# Patient Record
Sex: Female | Born: 1975 | Race: White | Hispanic: No | Smoking: Never smoker
Health system: Southern US, Community
[De-identification: ages and names within clinical notes are randomized; demographics above are authoritative.]

## PROBLEM LIST (undated history)

## (undated) DIAGNOSIS — R002 Palpitations: Secondary | ICD-10-CM

## (undated) DIAGNOSIS — R5383 Other fatigue: Secondary | ICD-10-CM

## (undated) DIAGNOSIS — L7211 Pilar cyst: Secondary | ICD-10-CM

## (undated) DIAGNOSIS — I340 Nonrheumatic mitral (valve) insufficiency: Secondary | ICD-10-CM

## (undated) HISTORY — PX: TONSILLECTOMY: SUR1361

## (undated) HISTORY — PX: OTHER SURGICAL HISTORY: SHX169

## (undated) HISTORY — DX: Palpitations: R00.2

## (undated) HISTORY — DX: Pilar cyst: L72.11

## (undated) HISTORY — DX: Other fatigue: R53.83

---

## 1996-09-04 HISTORY — PX: TUBAL LIGATION: SHX77

## 2006-10-22 ENCOUNTER — Emergency Department (HOSPITAL_COMMUNITY): Admission: EM | Admit: 2006-10-22 | Discharge: 2006-10-22 | Payer: Self-pay | Admitting: Emergency Medicine

## 2007-10-30 ENCOUNTER — Emergency Department (HOSPITAL_COMMUNITY): Admission: EM | Admit: 2007-10-30 | Discharge: 2007-10-30 | Payer: Self-pay | Admitting: Emergency Medicine

## 2008-01-07 ENCOUNTER — Emergency Department (HOSPITAL_COMMUNITY): Admission: EM | Admit: 2008-01-07 | Discharge: 2008-01-07 | Payer: Self-pay | Admitting: Emergency Medicine

## 2010-04-28 ENCOUNTER — Emergency Department (HOSPITAL_COMMUNITY): Admission: EM | Admit: 2010-04-28 | Discharge: 2010-04-28 | Payer: Self-pay | Admitting: Emergency Medicine

## 2010-05-03 ENCOUNTER — Emergency Department (HOSPITAL_COMMUNITY): Admission: EM | Admit: 2010-05-03 | Discharge: 2010-05-03 | Payer: Self-pay | Admitting: Emergency Medicine

## 2012-09-06 ENCOUNTER — Encounter (HOSPITAL_COMMUNITY): Payer: Self-pay | Admitting: Emergency Medicine

## 2012-09-06 ENCOUNTER — Emergency Department (HOSPITAL_COMMUNITY)
Admission: EM | Admit: 2012-09-06 | Discharge: 2012-09-06 | Disposition: A | Discharge status: Home / Self Care | Payer: BC Managed Care – PPO | Attending: Emergency Medicine | Admitting: Emergency Medicine

## 2012-09-06 DIAGNOSIS — J111 Influenza due to unidentified influenza virus with other respiratory manifestations: Secondary | ICD-10-CM

## 2012-09-06 DIAGNOSIS — J3489 Other specified disorders of nose and nasal sinuses: Secondary | ICD-10-CM | POA: Insufficient documentation

## 2012-09-06 DIAGNOSIS — R5381 Other malaise: Secondary | ICD-10-CM | POA: Insufficient documentation

## 2012-09-06 DIAGNOSIS — R630 Anorexia: Secondary | ICD-10-CM | POA: Insufficient documentation

## 2012-09-06 DIAGNOSIS — IMO0001 Reserved for inherently not codable concepts without codable children: Secondary | ICD-10-CM | POA: Insufficient documentation

## 2012-09-06 DIAGNOSIS — R509 Fever, unspecified: Secondary | ICD-10-CM | POA: Insufficient documentation

## 2012-09-06 DIAGNOSIS — R51 Headache: Secondary | ICD-10-CM | POA: Insufficient documentation

## 2012-09-06 DIAGNOSIS — J029 Acute pharyngitis, unspecified: Secondary | ICD-10-CM | POA: Insufficient documentation

## 2012-09-06 DIAGNOSIS — R110 Nausea: Secondary | ICD-10-CM | POA: Insufficient documentation

## 2012-09-06 MED ORDER — OSELTAMIVIR PHOSPHATE 75 MG PO CAPS: 75.0000 mg | ORAL_CAPSULE | Freq: Two times a day (BID) | ORAL | Status: DC

## 2012-09-06 NOTE — ED Provider Notes (Signed)
History     CSN: [id number]  Arrival date & time 09/06/12  1511   First MD Initiated Contact with Patient 09/06/12 1604      No chief complaint on file.   (Consider location/radiation/quality/duration/timing/severity/associated sxs/prior treatment) HPI Comments: Pt presents to the ED with complaints of flu-like symptoms of dry cough, nasal congestion, sore throat, muscle aches, chills, fever, headaches, fatigue, decreased appetite.  She reports that her fever was 102 orally yesterday. The patient states that the symptoms started two days ago.  Pt has been around other sick contacts and did not get the flu shot this year. The patient denies neck pain/stiffness, weakness, vision changes, severe abdominal pain, inability to eat or drink, difficulty breathing, SOB, wheezing, or chest pain. The patient has tried Alka-Seltzer for her symptoms without relief.    The history is provided by the patient.    No past medical history on file.  No past surgical history on file.  No family history on file.  History  Substance Use Topics  . Smoking status: Not on file  . Smokeless tobacco: Not on file  . Alcohol Use: Not on file    OB History    No data available      Review of Systems  Constitutional: Positive for fever, chills, appetite change and fatigue.  HENT: Positive for congestion, sore throat and rhinorrhea. Negative for ear pain, drooling, trouble swallowing, neck pain, neck stiffness and voice change.   Respiratory: Positive for cough. Negative for wheezing.   Gastrointestinal: Positive for nausea. Negative for vomiting, abdominal pain, diarrhea and constipation.  Genitourinary: Negative for dysuria.  Skin: Negative for rash.  Neurological: Positive for headaches.  Psychiatric/Behavioral: Negative for confusion.    Allergies  Penicillins  Home Medications   Current Outpatient Rx  Name  Route  Sig  Dispense  Refill  . ASPIRIN EFFERVESCENT 325 MG PO TBEF   Oral   Take  325 mg by mouth every 6 (six) hours as needed. Cold symptoms           BP 109/71  Pulse 84  Temp 98.7 F (37.1 C) (Oral)  Resp 18  SpO2 98%  Physical Exam  Nursing note and vitals reviewed. Constitutional: She appears well-developed and well-nourished. No distress.  HENT:  Head: Normocephalic and atraumatic.  Right Ear: Tympanic membrane and ear canal normal.  Left Ear: Tympanic membrane and ear canal normal.  Nose: Rhinorrhea present.  Mouth/Throat: Uvula is midline, oropharynx is clear and moist and mucous membranes are normal.  Eyes: EOM are normal. Pupils are equal, round, and reactive to light.  Neck: Normal range of motion. Neck supple.  Cardiovascular: Normal rate, regular rhythm and normal heart sounds.   Pulmonary/Chest: Effort normal and breath sounds normal. No respiratory distress. She has no wheezes. She has no rales.  Abdominal: Soft. There is no tenderness.  Neurological: She is alert.  Skin: Skin is warm and dry. No rash noted. She is not diaphoretic.  Psychiatric: She has a normal mood and affect.    ED Course  Procedures (including critical care time)  Labs Reviewed - No data to display No results found.   No diagnosis found.    MDM  Patient with symptoms consistent with influenza.  Vitals are stable.  No signs of dehydration, tolerating PO's.  Lungs are clear. Due to patient's presentation and physical exam a chest x-ray was not ordered bc likely diagnosis of flu.  Discussed the option of CXR with the patient.  She  opted not to have a CXR at this time.  No hypoxia or tachypnea.  Discussed the cost versus benefit of Tamiflu treatment with the patient.  Patient within 48 hours of the onset of symptoms.  Therefore, patient treated with Tamiflu.  Patient will be discharged with instructions to orally hydrate, rest, and use over-the-counter medications such as anti-inflammatories ibuprofen and Aleve for muscle aches and Tylenol for fever.            Pascal Lux [place], PA-C 09/06/12 [phone]

## 2012-09-06 NOTE — ED Notes (Signed)
Pt reports fever, cough and chills x 48 hrs

## 2012-09-07 NOTE — ED Provider Notes (Signed)
Medical screening examination/treatment/procedure(s) were performed by non-physician practitioner and as supervising physician I was immediately available for consultation/collaboration.  Marya Lowden T Doralee Kocak, MD 09/07/12 1644 

## 2012-10-18 ENCOUNTER — Encounter (HOSPITAL_COMMUNITY): Payer: Self-pay | Admitting: Emergency Medicine

## 2012-10-18 ENCOUNTER — Emergency Department (HOSPITAL_COMMUNITY): Payer: BC Managed Care – PPO

## 2012-10-18 ENCOUNTER — Emergency Department (HOSPITAL_COMMUNITY)
Admission: EM | Admit: 2012-10-18 | Discharge: 2012-10-18 | Disposition: A | Discharge status: Home / Self Care | Payer: BC Managed Care – PPO | Attending: Emergency Medicine | Admitting: Emergency Medicine

## 2012-10-18 DIAGNOSIS — Y9389 Activity, other specified: Secondary | ICD-10-CM | POA: Insufficient documentation

## 2012-10-18 DIAGNOSIS — W010XXA Fall on same level from slipping, tripping and stumbling without subsequent striking against object, initial encounter: Secondary | ICD-10-CM | POA: Insufficient documentation

## 2012-10-18 DIAGNOSIS — S62109A Fracture of unspecified carpal bone, unspecified wrist, initial encounter for closed fracture: Secondary | ICD-10-CM

## 2012-10-18 DIAGNOSIS — S52599A Other fractures of lower end of unspecified radius, initial encounter for closed fracture: Secondary | ICD-10-CM | POA: Insufficient documentation

## 2012-10-18 DIAGNOSIS — R229 Localized swelling, mass and lump, unspecified: Secondary | ICD-10-CM | POA: Insufficient documentation

## 2012-10-18 DIAGNOSIS — Y929 Unspecified place or not applicable: Secondary | ICD-10-CM | POA: Insufficient documentation

## 2012-10-18 DIAGNOSIS — R209 Unspecified disturbances of skin sensation: Secondary | ICD-10-CM | POA: Insufficient documentation

## 2012-10-18 MED ORDER — OXYCODONE-ACETAMINOPHEN 5-325 MG PO TABS
1.0000 | ORAL_TABLET | Freq: Once | ORAL | Status: AC
  Administered 2012-10-18: 1 via ORAL
  Filled 2012-10-18: qty 1

## 2012-10-18 MED ORDER — OXYCODONE-ACETAMINOPHEN 5-325 MG PO TABS: 2.0000 | ORAL_TABLET | ORAL | Status: DC | PRN

## 2012-10-18 MED ORDER — IBUPROFEN 800 MG PO TABS
800.0000 mg | ORAL_TABLET | Freq: Once | ORAL | Status: AC
  Administered 2012-10-18: 800 mg via ORAL
  Filled 2012-10-18: qty 1

## 2012-10-18 NOTE — Progress Notes (Signed)
Pt states she is seen by providers of [facility] -- [facility], [facility], [place] [facility] etc [address] [place], [place] [address]

## 2012-10-18 NOTE — ED Notes (Signed)
Per slipped on ice, used hand/wrist to brace self-right wrist deformity

## 2012-10-19 NOTE — ED Provider Notes (Signed)
History     CSN: [id number]  Arrival date & time 10/18/12  4098   First MD Initiated Contact with Patient 10/18/12 1145      Chief Complaint  Patient presents with  . Wrist Pain  . Fall    (Consider location/radiation/quality/duration/timing/severity/associated sxs/prior treatment) Patient is a 37 y.o. female presenting with wrist pain and fall. The history is provided by the patient. No language interpreter was used.  Wrist Pain This is a new problem. The current episode started today. The problem occurs constantly. The problem has been unchanged. Associated symptoms include joint swelling and numbness. Pertinent negatives include no nausea or vomiting. The symptoms are aggravated by bending. She has tried nothing for the symptoms.  Fall Associated symptoms include numbness. Pertinent negatives include no nausea and no vomiting.  37yo female with fall on ice today and R wrist pain.   History reviewed. No pertinent past medical history.  Past Surgical History  Procedure Laterality Date  . Tonsillectomy      Family History  Problem Relation Age of Onset  . Adopted: Yes    History  Substance Use Topics  . Smoking status: Never Smoker   . Smokeless tobacco: Not on file  . Alcohol Use: Yes    OB History   Grav Para Term Preterm Abortions TAB SAB Ect Mult Living                  Review of Systems  Constitutional: Negative.   HENT: Negative.   Eyes: Negative.   Respiratory: Negative.   Cardiovascular: Negative.   Gastrointestinal: Negative.  Negative for nausea and vomiting.  Musculoskeletal: Positive for joint swelling.       R wrist pain  Neurological: Positive for numbness.  Psychiatric/Behavioral: Negative.   All other systems reviewed and are negative.    Allergies  Penicillins  Home Medications   Current Outpatient Rx  Name  Route  Sig  Dispense  Refill  . Naproxen Sodium (ALEVE) 220 MG CAPS   Oral   Take 220 mg by mouth daily as needed  (pain).         Marland Kitchen aspirin-sod bicarb-citric acid (ALKA-SELTZER) 325 MG TBEF   Oral   Take 325 mg by mouth every 6 (six) hours as needed. Cold symptoms         . oseltamivir (TAMIFLU) 75 MG capsule   Oral   Take 1 capsule (75 mg total) by mouth every 12 (twelve) hours.   10 capsule   0   . oxyCODONE-acetaminophen (PERCOCET/ROXICET) 5-325 MG per tablet   Oral   Take 2 tablets by mouth every 4 (four) hours as needed for pain.   15 tablet   0     BP 108/67  Pulse 76  Temp(Src) 98.2 F (36.8 C) (Oral)  SpO2 100%  LMP 10/18/2012  Physical Exam  Nursing note and vitals reviewed. Constitutional: She is oriented to person, place, and time. She appears well-developed and well-nourished.  HENT:  Head: Normocephalic and atraumatic.  Eyes: Conjunctivae and EOM are normal. Pupils are equal, round, and reactive to light.  Neck: Normal range of motion. Neck supple.  Cardiovascular: Normal rate.   Pulmonary/Chest: Effort normal.  Abdominal: Soft.  Musculoskeletal: Normal range of motion. She exhibits no edema and no tenderness.  R wrist tenderness with defomity.  + CMS below injury 2+ r radial pulse.  Neurological: She is alert and oriented to person, place, and time. She has normal reflexes.  Skin: Skin is warm  and dry.  Psychiatric: She has a normal mood and affect.    ED Course  Procedures (including critical care time)   Spoke with [place] ortho PA.  She will follow up next week.  Sugar tong today.      Labs Reviewed - No data to display Dg Wrist Complete Right  10/18/2012  *RADIOLOGY REPORT*  Clinical Data: Fall.  Right wrist pain and swelling.  RIGHT WRIST - COMPLETE 3+ VIEW  Comparison: None.  Findings: Transverse nondisplaced distal radius fracture through the metaphysis.  Loss of the normal volar tilt with minimal apex volar angulation.  The distal ulna is intact.  There is no extension into the radio-carpal articular surface. Carpal bones appear within normal  limits.  IMPRESSION: Transverse nondisplaced distal radius fracture with loss of the normal volar tilt.   Original Report Authenticated By: Andreas Newport, M.D.      1. Wrist fracture       MDM  R wrist fracture per x-ray reviewed by myself and ortho /with sugar tong splint and follow up next week.  Rx for percocet.  She understands to return for worsening symptoms.         Remi Haggard, NP 10/19/12 1118

## 2012-10-19 NOTE — ED Provider Notes (Signed)
Medical screening examination/treatment/procedure(s) were performed by non-physician practitioner and as supervising physician I was immediately available for consultation/collaboration.   Loren Racer, MD 10/19/12 [phone]

## 2015-09-07 ENCOUNTER — Emergency Department (HOSPITAL_COMMUNITY): Payer: Self-pay

## 2015-09-07 ENCOUNTER — Emergency Department (HOSPITAL_COMMUNITY)
Admission: EM | Admit: 2015-09-07 | Discharge: 2015-09-08 | Discharge status: Against Medical Advice | Payer: Self-pay | Attending: Emergency Medicine | Admitting: Emergency Medicine

## 2015-09-07 ENCOUNTER — Encounter (HOSPITAL_COMMUNITY): Payer: Self-pay | Admitting: Emergency Medicine

## 2015-09-07 DIAGNOSIS — R0602 Shortness of breath: Secondary | ICD-10-CM | POA: Insufficient documentation

## 2015-09-07 DIAGNOSIS — R42 Dizziness and giddiness: Secondary | ICD-10-CM | POA: Insufficient documentation

## 2015-09-07 HISTORY — DX: Nonrheumatic mitral (valve) insufficiency: I34.0

## 2015-09-07 LAB — BASIC METABOLIC PANEL
ANION GAP: 8 (ref 5–15)
BUN: 8 mg/dL (ref 6–20)
CHLORIDE: 106 mmol/L (ref 101–111)
CO2: 26 mmol/L (ref 22–32)
Calcium: 9.2 mg/dL (ref 8.9–10.3)
Creatinine, Ser: 0.51 mg/dL (ref 0.44–1.00)
GFR calc Af Amer: >60 mL/min (ref >60)
GLUCOSE: 85 mg/dL (ref 65–99)
POTASSIUM: 3.6 mmol/L (ref 3.5–5.1)
Sodium: 140 mmol/L (ref 135–145)

## 2015-09-07 LAB — CBC
HEMATOCRIT: 39 % (ref 36.0–46.0)
HEMOGLOBIN: 12.9 g/dL (ref 12.0–15.0)
MCH: 30.6 pg (ref 26.0–34.0)
MCHC: 33.1 g/dL (ref 30.0–36.0)
MCV: 92.6 fL (ref 78.0–100.0)
Platelets: 282 10*3/uL (ref 150–400)
RBC: 4.21 MIL/uL (ref 3.87–5.11)
RDW: 12.7 % (ref 11.5–15.5)
WBC: 6.1 10*3/uL (ref 4.0–10.5)

## 2015-09-07 LAB — I-STAT TROPONIN, ED: TROPONIN I, POC: 0 ng/mL (ref 0.00–0.08)

## 2015-09-07 NOTE — ED Notes (Addendum)
Patient called for room 3X

## 2015-09-07 NOTE — ED Notes (Signed)
Pt States for the last 3 weeks she has been having shortness of breath and dizziness that comes and goes. Pt states her job made her comes to be evaluated before she can work again. Pt states turning her her head makes the dizziness worse. Pt has no other neuro deficits.

## 2017-01-09 ENCOUNTER — Telehealth: Payer: Self-pay | Admitting: *Deleted

## 2017-01-09 NOTE — Telephone Encounter (Signed)
NOTES SENT TO SCHEDULING.

## 2017-01-11 ENCOUNTER — Encounter: Payer: Self-pay | Admitting: *Deleted

## 2017-02-05 ENCOUNTER — Encounter (INDEPENDENT_AMBULATORY_CARE_PROVIDER_SITE_OTHER): Payer: Self-pay

## 2017-02-05 ENCOUNTER — Ambulatory Visit (INDEPENDENT_AMBULATORY_CARE_PROVIDER_SITE_OTHER): Payer: 59 | Admitting: Internal Medicine

## 2017-02-05 VITALS — BP 114/64 | HR 61 | Ht 64.0 in | Wt 200.6 lb

## 2017-02-05 DIAGNOSIS — R002 Palpitations: Secondary | ICD-10-CM | POA: Diagnosis not present

## 2017-02-05 DIAGNOSIS — R5383 Other fatigue: Secondary | ICD-10-CM

## 2017-02-05 DIAGNOSIS — I059 Rheumatic mitral valve disease, unspecified: Secondary | ICD-10-CM | POA: Diagnosis not present

## 2017-02-05 MED ORDER — METOPROLOL SUCCINATE ER 25 MG PO TB24: 25.0000 mg | ORAL_TABLET | Freq: Every day | ORAL | 11 refills | Status: DC

## 2017-02-05 NOTE — Patient Instructions (Signed)
Medication Instructions:  Your physician recommends that you continue on your current medications as directed. Please refer to the Current Medication list given to you today.   Labwork: None Ordered   Testing/Procedures: Your physician has requested that you have an echocardiogram. Echocardiography is a painless test that uses sound waves to create images of your heart. It provides your doctor with information about the size and shape of your heart and how well your heart's chambers and valves are working. This procedure takes approximately one hour. There are no restrictions for this procedure.   Follow-Up: Your physician recommends that you schedule a follow-up appointment in: as needed with Dr. Harrington Challenger based on echo results   If you need a refill on your cardiac medications before your next appointment, please call your pharmacy.   Thank you for choosing [facility]! Christen Bame, RN [phone]

## 2017-02-05 NOTE — Progress Notes (Signed)
Cardiology Office Note   Date:  02/05/2017   ID:  Valerie Reeves, DOB September 25, 1975, MRN [id number]  PCP:  Donald Prose, MD  Cardiologist:   Dorris Carnes, MD   Patient referred by Dr Nancy Fetter for continued evaluation of MV      History of Present Illness: Valerie Reeves is a 41 y.o. female with a history of mitral regurgitation   IN 2015 she began having  episodes like 'elephant sitting on chest   Felt heart beat hard for about 30 sec SOB with actvitiy  She had echo done  Showed MR  That was mild to moderate  Seen by Cardiology    Placed on Toprol XL   SInce then she continues to have spells  There initally were better after starting Toprol  NOw seem to be getting worse.   DIzzy at times  No syncope   Walks all day at work  [employer] 30 min for exercise and uses body strength machine 30 min per day      Current Meds  Medication Sig  . meloxicam (MOBIC) 7.5 MG tablet Take 7.5 mg by mouth as directed.   . metoprolol succinate (TOPROL-XL) 25 MG 24 hr tablet Take 25 mg by mouth daily.  . Naltrexone-Bupropion HCl ER (CONTRAVE) 8-90 MG TB12 Take by mouth as directed.     Allergies:   Penicillins; Gold-containing drug products; and Nickel   Past Medical History:  Diagnosis Date  . Mitral regurgitation, acute     Past Surgical History:  Procedure Laterality Date  . TONSILLECTOMY       Social History:  The patient  reports that she has never smoked. She does not have any smokeless tobacco history on file. She reports that she drinks alcohol.   Family History:  The patient's family history includes Diabetes in her paternal grandmother; Heart attack in her maternal grandfather; Hypertension in her paternal grandmother; Stroke in her maternal grandmother and paternal grandmother. She was adopted.    ROS:  Please see the history of present illness. All other systems are reviewed and  Negative to the above problem except as noted.    PHYSICAL EXAM: VS:   BP 114/64   Pulse 61   Ht 5' 4"  (1.626 m)   Wt 91 kg (200 lb 9.6 oz)   LMP 10/18/2012   BMI 34.43 kg/m   GEN: Obese 41 yo, in no acute distress  HEENT: normal  Neck: no JVD, carotid bruits, or masses Cardiac: RRR; no murmurs, rubs, or gallops,no edema  Respiratory:  clear to auscultation bilaterally, normal work of breathing GI: soft, nontender, nondistended, + BS  No hepatomegaly  MS: no deformity Moving all extremities   Skin: warm and dry, no rash Neuro:  Strength and sensation are intact Psych: euthymic mood, full affect   EKG:  EKG is ordered today.  SR 61 bpm     Lipid Panel No results found for: CHOL, TRIG, HDL, CHOLHDL, VLDL, LDLCALC, LDLDIRECT    Wt Readings from Last 3 Encounters:  02/05/17 91 kg (200 lb 9.6 oz)  09/07/15 82.6 kg (182 lb)      ASSESSMENT AND PLAN:  1  Mitral valve dz   I do not hear a murmur on exam today  Will set up for echo to evlauate  2  Fatigue  Chest tightness  I do not think representischemia   Ordering echo to eval valve and the diastolic properties of heart  3  Palpitations  Keep on toprol  Stay hydrated  Could increase toprol to BID and follow    Further f/u will be  based on test results     Current medicines are reviewed at length with the patient today.  The patient does not have concerns regarding medicines.  Signed, Dorris Carnes, MD  02/05/2017 9:20 AM    [address], [place], [place]  [address] Phone: [phone]; Fax: [phone]

## 2017-02-19 ENCOUNTER — Other Ambulatory Visit (HOSPITAL_COMMUNITY): Payer: 59

## 2017-04-04 ENCOUNTER — Telehealth: Payer: Self-pay | Admitting: Internal Medicine

## 2017-04-04 DIAGNOSIS — I059 Rheumatic mitral valve disease, unspecified: Secondary | ICD-10-CM

## 2017-04-04 NOTE — Telephone Encounter (Signed)
I have placed another order for echo for her mitral valve disease.  Will send a staff message asking [facility] to call and schedule this, however, per echo supervisor:   echocardiogram  Received: [name], Valerie Reeves  [name], [name], MD  Cc: Rodman Key, RN        We have made 3 attempts to contact patient to schedule echocardiogram from the [facility]. Patient has no showed once for echocardiogram     Will try to get rescheduled.

## 2017-04-04 NOTE — Telephone Encounter (Signed)
New message    Pt is calling to reschedule Echo from June. There isn't an order in Epic, it says it was canceled.

## 2017-04-11 ENCOUNTER — Ambulatory Visit: Payer: Self-pay | Admitting: Diagnostic Neuroimaging

## 2017-04-23 ENCOUNTER — Other Ambulatory Visit (HOSPITAL_COMMUNITY): Payer: 59

## 2017-04-30 ENCOUNTER — Other Ambulatory Visit (HOSPITAL_COMMUNITY): Payer: 59

## 2017-05-02 ENCOUNTER — Encounter: Payer: Self-pay | Admitting: *Deleted

## 2017-05-03 ENCOUNTER — Other Ambulatory Visit: Payer: Self-pay

## 2017-05-03 ENCOUNTER — Ambulatory Visit (HOSPITAL_COMMUNITY): Payer: 59 | Attending: Cardiovascular Disease

## 2017-05-03 DIAGNOSIS — I059 Rheumatic mitral valve disease, unspecified: Secondary | ICD-10-CM

## 2017-05-03 DIAGNOSIS — R5383 Other fatigue: Secondary | ICD-10-CM | POA: Insufficient documentation

## 2017-05-03 DIAGNOSIS — R002 Palpitations: Secondary | ICD-10-CM | POA: Diagnosis not present

## 2017-05-03 DIAGNOSIS — R0600 Dyspnea, unspecified: Secondary | ICD-10-CM | POA: Insufficient documentation

## 2017-05-04 ENCOUNTER — Ambulatory Visit: Payer: Self-pay | Admitting: Diagnostic Neuroimaging

## 2017-05-11 ENCOUNTER — Encounter: Payer: Self-pay | Admitting: Diagnostic Neuroimaging

## 2017-05-11 ENCOUNTER — Telehealth: Payer: Self-pay | Admitting: Internal Medicine

## 2017-05-11 NOTE — Telephone Encounter (Signed)
Left detailed message of results on voice mail as per requested.

## 2017-05-11 NOTE — Telephone Encounter (Signed)
New message     Pt is only available for the next 20 minutes, please call regarding her EKG results, if no answer leave complete message on her voicemail .

## 2017-05-28 ENCOUNTER — Telehealth: Payer: Self-pay | Admitting: Diagnostic Neuroimaging

## 2017-05-28 NOTE — Telephone Encounter (Signed)
Pt called in she has no showed on 2 appointments. Pt said she is has been getting calls from the clinic but a msg is not being left. Pt works 1:30pm  - 2:30am. Can this appointment be r/s again? A msg can left on her VM and she will return the call.

## 2017-05-28 NOTE — Telephone Encounter (Signed)
Ok to reschedule again?

## 2017-05-29 NOTE — Telephone Encounter (Signed)
Ok to offer 1 more try to come in for new consult. -VRP

## 2017-07-11 ENCOUNTER — Encounter (HOSPITAL_COMMUNITY): Payer: Self-pay | Admitting: Emergency Medicine

## 2017-07-11 ENCOUNTER — Emergency Department (HOSPITAL_COMMUNITY)
Admission: EM | Admit: 2017-07-11 | Discharge: 2017-07-11 | Disposition: A | Discharge status: Home / Self Care | Payer: Worker's Compensation | Attending: Emergency Medicine | Admitting: Emergency Medicine

## 2017-07-11 DIAGNOSIS — S0591XA Unspecified injury of right eye and orbit, initial encounter: Secondary | ICD-10-CM | POA: Diagnosis present

## 2017-07-11 DIAGNOSIS — Y9389 Activity, other specified: Secondary | ICD-10-CM | POA: Insufficient documentation

## 2017-07-11 DIAGNOSIS — Y92510 Bank as the place of occurrence of the external cause: Secondary | ICD-10-CM | POA: Insufficient documentation

## 2017-07-11 DIAGNOSIS — Y990 Civilian activity done for income or pay: Secondary | ICD-10-CM | POA: Insufficient documentation

## 2017-07-11 DIAGNOSIS — S0501XA Injury of conjunctiva and corneal abrasion without foreign body, right eye, initial encounter: Secondary | ICD-10-CM

## 2017-07-11 DIAGNOSIS — W208XXA Other cause of strike by thrown, projected or falling object, initial encounter: Secondary | ICD-10-CM | POA: Insufficient documentation

## 2017-07-11 DIAGNOSIS — Z79899 Other long term (current) drug therapy: Secondary | ICD-10-CM | POA: Diagnosis not present

## 2017-07-11 MED ORDER — ERYTHROMYCIN 5 MG/GM OP OINT
TOPICAL_OINTMENT | Freq: Four times a day (QID) | OPHTHALMIC | Status: DC
Start: 2017-07-11 — End: 2017-07-11
  Administered 2017-07-11: 12:00:00 via OPHTHALMIC
  Filled 2017-07-11: qty 3.5

## 2017-07-11 MED ORDER — TETRACAINE HCL 0.5 % OP SOLN
2.0000 [drp] | Freq: Once | OPHTHALMIC | Status: AC
  Administered 2017-07-11: 2 [drp] via OPHTHALMIC
  Filled 2017-07-11: qty 4

## 2017-07-11 MED ORDER — FLUORESCEIN SODIUM 1 MG OP STRP
1.0000 | ORAL_STRIP | Freq: Once | OPHTHALMIC | Status: AC
  Administered 2017-07-11: 1 via OPHTHALMIC
  Filled 2017-07-11: qty 1

## 2017-07-11 NOTE — ED Provider Notes (Signed)
[facility] DEPT Provider Note  CSN: [id number] Arrival date & time: 07/11/17 6712  Chief Complaint(s) Eye Problem  HPI Valerie Reeves is a 41 y.o. female with no pertinent past medical history presents to the emergency department with 1 day of right eye pain.  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  She reports that last night she felt that something flew into her eye and got stuck.  Reports a dull pain since that time which has not improved.  Pain is exacerbated with eye movement and rubbing of her eye.  No alleviating factors.  She endorses mild right blurry vision.  No headache, recent fevers, trauma.  HPI  Past Medical History Past Medical History:  Diagnosis Date  . Mitral regurgitation, acute    There are no active problems to display for this patient.  Home Medication(s) Prior to Admission medications   Medication Sig Start Date End Date Taking? Authorizing Provider  metoprolol succinate (TOPROL-XL) 25 MG 24 hr tablet Take 1 tablet (25 mg total) by mouth daily. May take additional 25 mg daily for palpitations 02/05/17  Yes Fay Records, MD  Naltrexone-Bupropion HCl ER (CONTRAVE) 8-90 MG TB12 Take 2 tablets 2 (two) times daily by mouth.    Yes [provider]  naproxen sodium (ALEVE) 220 MG tablet Take 220 mg 2 (two) times daily as needed by mouth (inflammation/pain of hand).   Yes [provider]                                                                                                                                    Past Surgical History Past Surgical History:  Procedure Laterality Date  . nerve decompressed  Left    leg   . TONSILLECTOMY     Family History Family History  Adopted: Yes  Problem Relation Age of Onset  . Stroke Maternal Grandmother   . Heart attack Maternal Grandfather   . Hypertension Paternal Grandmother   . Diabetes Paternal  Grandmother   . Stroke Paternal Grandmother   . Heart attack Paternal Grandfather   . Hypertension Paternal Grandfather   . Cancer - Lung Brother     Social History Social History   Tobacco Use  . Smoking status: Never Smoker  . Smokeless tobacco: Never Used  Substance Use Topics  . Alcohol use: Yes  . Drug use: No   Allergies Penicillins; Gold-containing drug products; and Nickel  Review of Systems Review of Systems  Physical Exam Vital Signs  I have reviewed the triage vital signs BP 118/71 (BP Location: Right Arm)   Pulse (!) 52   Temp 97.7 F (36.5 C) (Oral)   Resp 18   Ht 5' 7"  (1.702 m)   LMP 10/18/2012   SpO2 98%   BMI 31.42 kg/m   Physical Exam  Constitutional: She is oriented to person,  place, and time. She appears well-developed and well-nourished. No distress.  HENT:  Head: Normocephalic and atraumatic.  Right Ear: External ear normal.  Left Ear: External ear normal.  Nose: Nose normal.  Eyes: EOM and lids are normal. Pupils are equal, round, and reactive to light. Lids are everted and swept, no foreign bodies found. Right eye exhibits no chemosis and no discharge. No foreign body present in the right eye. Left eye exhibits no chemosis, no discharge, no exudate and no hordeolum. Right conjunctiva is injected (mild). No scleral icterus.  Slit lamp exam:      The right eye shows corneal abrasion and fluorescein uptake. The right eye shows no foreign body and no anterior chamber bulge.       The left eye shows no corneal abrasion, no foreign body, no fluorescein uptake and no anterior chamber bulge.  Acuity: with corrective glasses R - 20/50 L - 20/30  Neck: Normal range of motion and phonation normal.  Cardiovascular: Normal rate and regular rhythm.  Pulmonary/Chest: Effort normal. No stridor. No respiratory distress.  Abdominal: She exhibits no distension.  Musculoskeletal: Normal range of motion. She exhibits no edema.  Neurological: She is alert and  oriented to person, place, and time.  Skin: She is not diaphoretic.  Psychiatric: She has a normal mood and affect. Her behavior is normal.  Vitals reviewed.   ED Results and Treatments Labs (all labs ordered are listed, but only abnormal results are displayed) Labs Reviewed - No data to display                                                                                                                       EKG  EKG Interpretation  Date/Time:    Ventricular Rate:    PR Interval:    QRS Duration:   QT Interval:    QTC Calculation:   R Axis:     Text Interpretation:        Radiology No results found. Pertinent labs & imaging results that were available during my care of the patient were reviewed by me and considered in my medical decision making (see chart for details).  Medications Ordered in ED Medications  tetracaine (PONTOCAINE) 0.5 % ophthalmic solution 2 drop (not administered)  fluorescein ophthalmic strip 1 strip (not administered)  erythromycin ophthalmic ointment (not administered)  Procedures Procedures  (including critical care time)  Medical Decision Making / ED Course I have reviewed the nursing notes for this encounter and the patient's prior records (if available in EHR or on provided paperwork).    Corneal abrasion. No contact use. Not concerning for glaucoma, HSV or VZV. Will treat with Erythromycin and Optho f/u as needed.  The patient is safe for discharge with strict return precautions.   Final Clinical Impression(s) / ED Diagnoses Final diagnoses:  Abrasion of right cornea, initial encounter   Disposition: Discharge  Condition: Good  I have discussed the results, Dx and Tx plan with the patient who expressed understanding and agree(s) with the plan. Discharge instructions discussed at great length. The patient  was given strict return precautions who verbalized understanding of the instructions. No further questions at time of discharge.     Follow Up: Valerie Reeves, [place] [address] [phone]   in 5-7 days, If symptoms do not improve or  worsen     This chart was dictated using voice recognition software.  Despite best efforts to proofread,  errors can occur which can change the documentation meaning.   Fatima Blank, MD 07/11/17 1123

## 2017-07-11 NOTE — ED Triage Notes (Signed)
Patient reports that while at work last night she got something in her right eye and having blurred vision and pain with sensitivity to light ever since.

## 2017-07-11 NOTE — ED Notes (Signed)
PT WEARS CORRECTIVE GLASSES HOWEVER DID NOT USE FOR VISUAL ACUITY

## 2017-07-18 ENCOUNTER — Encounter: Payer: Self-pay | Admitting: Diagnostic Neuroimaging

## 2017-07-18 ENCOUNTER — Ambulatory Visit (INDEPENDENT_AMBULATORY_CARE_PROVIDER_SITE_OTHER): Payer: 59 | Admitting: Diagnostic Neuroimaging

## 2017-07-18 VITALS — BP 115/78 | HR 59 | Ht 67.0 in | Wt 173.0 lb

## 2017-07-18 DIAGNOSIS — R4689 Other symptoms and signs involving appearance and behavior: Secondary | ICD-10-CM

## 2017-07-18 DIAGNOSIS — R269 Unspecified abnormalities of gait and mobility: Secondary | ICD-10-CM

## 2017-07-18 DIAGNOSIS — G4700 Insomnia, unspecified: Secondary | ICD-10-CM

## 2017-07-18 DIAGNOSIS — R519 Headache, unspecified: Secondary | ICD-10-CM

## 2017-07-18 DIAGNOSIS — R413 Other amnesia: Secondary | ICD-10-CM | POA: Diagnosis not present

## 2017-07-18 DIAGNOSIS — R51 Headache: Secondary | ICD-10-CM

## 2017-07-18 NOTE — Patient Instructions (Signed)
Thank you for coming to see [place] at [facility]. I hope we have been able to provide you high quality care today.  You may receive a patient satisfaction survey over the next few weeks. We would appreciate your feedback and comments so that we may continue to improve ourselves and the health of our patients.  - check MRI brain, EEG and B12 testing   ~~~~~~~~~~~~~~~~~~~~~~~~~~~~~~~~~~~~~~~~~~~~~~~~~~~~~~~~~~~~~~~~~  DR. PENUMALLI'S GUIDE TO HAPPY AND HEALTHY LIVING These are some of my general health and wellness recommendations. Some of them may apply to you better than others. Please use common sense as you try these suggestions and feel free to ask me any questions.   ACTIVITY/FITNESS Mental, social, emotional and physical stimulation are very important for brain and body health. Try learning a new activity (arts, music, language, sports, games).  Keep moving your body to the best of your abilities. You can do this at home, inside or outside, the park, community center, gym or anywhere you like. Consider a physical therapist or personal trainer to get started. Consider the app Sworkit. Fitness trackers such as smart-watches, smart-phones or Fitbits can help as well.   NUTRITION Eat more plants: colorful vegetables, nuts, seeds and berries.  Eat less sugar, salt, preservatives and processed foods.  Avoid toxins such as cigarettes and alcohol.  Drink water when you are thirsty. Warm water with a slice of lemon is an excellent morning drink to start the day.  Consider these websites for more information The Nutrition Source (https://www.henry-hernandez.biz/) Precision Nutrition (WindowBlog.ch)   RELAXATION Consider practicing mindfulness meditation or other relaxation techniques such as deep breathing, prayer, yoga, tai chi, massage. See website mindful.org or the apps Headspace or Calm to help get started.   SLEEP Try  to get at least 7-8+ hours sleep per day. Regular exercise and reduced caffeine will help you sleep better. Practice good sleep hygeine techniques. See website sleep.org for more information.   PLANNING Prepare estate planning, living will, healthcare POA documents. Sometimes this is best planned with the help of an attorney. Theconversationproject.org and agingwithdignity.org are excellent resources.

## 2017-07-18 NOTE — Progress Notes (Signed)
[facility]  PATIENT: Valerie Reeves DOB: Mar 05, 1976  REFERRING CLINICIAN:  Matilde Haymaker HISTORY FROM: patient and chart review REASON FOR VISIT: new consult    HISTORICAL  CHIEF COMPLAINT:  Chief Complaint  Patient presents with  . NP Theodoro Doing, MD  . Fatigue    works second shift, co. change, working longer hrs.  Has sleep issues too.  R eye visual changes. MMSE 26/30  . Memory Loss  . difficulty with word finding    HISTORY OF PRESENT ILLNESS:   41 year old female here for evaluation of memory loss, sleep difficulty, headaches, weight gain.  Patient has had one year of gradual onset progressive short-term memory problems, word finding difficulties, balance difficulties.  Patient also having intermittent episodes of right frontal headache and pain, followed by confusion.  Patient has had 3-4 episodes per week.  Each episode last 3-4 minutes at a time.  No convulsions or loss of consciousness.  No prodromal infections, traumas or accidents.  1 year ago patient was having onset of significant insomnia, and therefore went to mental health for evaluation.  Patient was tried on Lexapro for 3 months but this did not help.    REVIEW OF SYSTEMS: Full 14 system review of systems performed and negative with exception of: memory loss confusion insomnia headache not enough sleep runny nose dizziness restless legs.    ALLERGIES: Allergies  Allergen Reactions  . Penicillins Anaphylaxis    41 years old- anaphylactic shock  Has patient had a PCN reaction causing immediate rash, facial/tongue/throat swelling, SOB or lightheadedness with hypotension: yes Has patient had a PCN reaction causing severe rash involving mucus membranes or skin necrosis:unknown Has patient had a PCN reaction that required hospitalization: yes Has patient had a PCN reaction occurring within the last 10 years:no If all of the above answers are "NO", then may proceed with Cephalosporin  use.   . Gold-Containing Drug Products Other (See Comments)  . Nickel Rash    HOME MEDICATIONS: Outpatient Medications Prior to Visit  Medication Sig Dispense Refill  . metoprolol succinate (TOPROL-XL) 25 MG 24 hr tablet Take 1 tablet (25 mg total) by mouth daily. May take additional 25 mg daily for palpitations 60 tablet 11  . Naltrexone-Bupropion HCl ER (CONTRAVE) 8-90 MG TB12 Take 2 tablets 2 (two) times daily by mouth.     . naproxen sodium (ALEVE) 220 MG tablet Take 220 mg 2 (two) times daily as needed by mouth (inflammation/pain of hand).     No facility-administered medications prior to visit.     PAST MEDICAL HISTORY: Past Medical History:  Diagnosis Date  . Fatigue   . Mitral regurgitation, acute   . Palpitations   . Pilar cyst     PAST SURGICAL HISTORY: Past Surgical History:  Procedure Laterality Date  . nerve decompressed  Left    leg   . OTHER SURGICAL HISTORY     hysterectomy 2006  . TONSILLECTOMY    . TUBAL LIGATION  1998    FAMILY HISTORY: Family History  Adopted: Yes  Problem Relation Age of Onset  . Stroke Maternal Grandmother   . Heart attack Maternal Grandfather   . Hypertension Paternal Grandmother   . Diabetes Paternal Grandmother   . Stroke Paternal Grandmother   . Heart attack Paternal Grandfather   . Hypertension Paternal Grandfather   . Cancer - Lung Brother     SOCIAL HISTORY:  Social History   Socioeconomic History  . Marital status: Divorced  Spouse name: Not on file  . Number of children: 5  . Years of education: Not on file  . Highest education level: Not on file  Social Needs  . Financial resource strain: Not on file  . Food insecurity - worry: Not on file  . Food insecurity - inability: Not on file  . Transportation needs - medical: Not on file  . Transportation needs - non-medical: Not on file  Occupational History    Comment: manager  Tobacco Use  . Smoking status: Never Smoker  . Smokeless tobacco: Never Used    Substance and Sexual Activity  . Alcohol use: No    Frequency: Never  . Drug use: No  . Sexual activity: Not on file  Other Topics Concern  . Not on file  Social History Narrative   Lives home with husband.  Has 5 grown kids.  Works at [employer] (works 2nd shift) additional hours now since company change.  Education BS     PHYSICAL EXAM  GENERAL EXAM/CONSTITUTIONAL: Vitals:  Vitals:   07/18/17 0808  BP: 115/78  Pulse: (!) 59  Weight: 173 lb (78.5 kg)  Height: 5' 7"  (1.702 m)     Body mass index is 27.1 kg/m.  Visual Acuity Screening   Right eye Left eye Both eyes  Without correction:     With correction: 20/40 20/40      Patient is in no distress; well developed, nourished and groomed; neck is supple  CARDIOVASCULAR:  Examination of carotid arteries is normal; no carotid bruits  Regular rate and rhythm, no murmurs  Examination of peripheral vascular system by observation and palpation is normal  EYES:  Ophthalmoscopic exam of optic discs and posterior segments is normal; no papilledema or hemorrhages  MUSCULOSKELETAL:  Gait, strength, tone, movements noted in Neurologic exam below  NEUROLOGIC: MENTAL STATUS:  MMSE - Mini Mental State Exam 07/18/2017  Orientation to time 5  Orientation to Place 4  Registration 3  Attention/ Calculation 4  Recall 1  Language- name 2 objects 2  Language- repeat 1  Language- follow 3 step command 3  Language- read & follow direction 1  Write a sentence 1  Copy design 1  Total score 26    awake, alert, oriented to person, place and time  recent and remote memory intact  normal attention and concentration  language fluent, comprehension intact, naming intact,   fund of knowledge appropriate  CRANIAL NERVE:   2nd - no papilledema on fundoscopic exam  2nd, 3rd, 4th, 6th - pupils equal and reactive to light, visual fields full to confrontation, extraocular muscles intact, no nystagmus  5th - facial  sensation symmetric  7th - facial strength symmetric  8th - hearing intact  9th - palate elevates symmetrically, uvula midline  11th - shoulder shrug symmetric  12th - tongue protrusion midline  MOTOR:   normal bulk and tone, full strength in the BUE, BLE  SENSORY:   normal and symmetric to light touch, temperature, vibration  COORDINATION:   finger-nose-finger, fine finger movements normal  REFLEXES:   deep tendon reflexes present and symmetric  GAIT/STATION:   narrow based gait; able to walk tandem; romberg is negative    DIAGNOSTIC DATA (LABS, IMAGING, TESTING) - I reviewed patient records, labs, notes, testing and imaging myself where available.  Lab Results  Component Value Date   WBC 6.1 09/07/2015   HGB 12.9 09/07/2015   HCT 39.0 09/07/2015   MCV 92.6 09/07/2015   PLT 282 09/07/2015  Component Value Date/Time   NA 140 09/07/2015 1703   K 3.6 09/07/2015 1703   CL 106 09/07/2015 1703   CO2 26 09/07/2015 1703   GLUCOSE 85 09/07/2015 1703   BUN 8 09/07/2015 1703   CREATININE 0.51 09/07/2015 1703   CALCIUM 9.2 09/07/2015 1703   GFRNONAA >60 09/07/2015 1703   GFRAA >60 09/07/2015 1703   No results found for: CHOL, HDL, LDLCALC, LDLDIRECT, TRIG, CHOLHDL No results found for: HGBA1C No results found for: VITAMINB12 No results found for: TSH   01/08/17 TSH - 3.03    ASSESSMENT AND PLAN  41 y.o. year old female here with intermittent memory loss, confusion, word finding diff, right frontal headaches, dizziness since past 1 year (~ Nov 2017).    Ddx: CNS autoimmune, inflamm, neoplasm, vascular vs anxiety / insomnia / metabolic  1. Memory loss   2. Gait difficulty   3. Right-sided headache   4. Spell of abnormal behavior   5. Insomnia, unspecified type      PLAN:  WORD FINDING DIFFICULTY / CONFUSION SPELLS / RIGHT FRONTAL HEADACHES - check MRI brain and EEG - check computer based cognitive testing to establish baseline status -  reviewed and encouraged brain healthy activities, including nutrition, exercise, cognitive stimulation, mental and emotional stress management, mindfulness therapy  Orders Placed This Encounter  Procedures  . MR BRAIN W WO CONTRAST  . Vitamin B12  . EEG adult  . Neuropsychological testing   Return in about 3 months (around 10/18/2017).    Penni Bombard, MD 06/77/0340, 3:52 AM Certified in Neurology, Neurophysiology and Neuroimaging  [facility] [address], [place], [place] [address] [phone]'

## 2017-07-19 LAB — VITAMIN B12: VITAMIN B 12: 281 pg/mL (ref 232–1245)

## 2017-07-20 ENCOUNTER — Telehealth: Payer: Self-pay | Admitting: *Deleted

## 2017-07-20 NOTE — Telephone Encounter (Signed)
-----   Message from Penni Bombard, MD sent at 07/20/2017 12:01 PM EST ----- Normal labs. Please call patient. -VRP

## 2017-07-20 NOTE — Telephone Encounter (Signed)
LMVM for pt on her mobile/home # that her labs were normal. She is to call back if questions.

## 2017-07-24 ENCOUNTER — Ambulatory Visit: Payer: 59

## 2017-07-24 DIAGNOSIS — R413 Other amnesia: Secondary | ICD-10-CM

## 2017-07-24 MED ORDER — GADOPENTETATE DIMEGLUMINE 469.01 MG/ML IV SOLN: 15.0000 mL | Freq: Once | INTRAVENOUS | Status: AC | PRN

## 2017-07-30 ENCOUNTER — Other Ambulatory Visit: Payer: 59

## 2017-07-31 ENCOUNTER — Telehealth: Payer: Self-pay | Admitting: *Deleted

## 2017-07-31 ENCOUNTER — Encounter: Payer: Self-pay | Admitting: Diagnostic Neuroimaging

## 2017-07-31 NOTE — Telephone Encounter (Signed)
-----   Message from Penni Bombard, MD sent at 07/30/2017 11:09 AM EST ----- Normal imaging results. Please call patient. Continue current plan. -VRP

## 2017-07-31 NOTE — Telephone Encounter (Signed)
Spoke to pt and relayed that her MRI was normal..  She verbalized understanding.

## 2017-10-08 ENCOUNTER — Encounter: Payer: Self-pay | Admitting: Diagnostic Neuroimaging

## 2017-12-17 DIAGNOSIS — J309 Allergic rhinitis, unspecified: Secondary | ICD-10-CM | POA: Diagnosis not present

## 2017-12-17 DIAGNOSIS — G4700 Insomnia, unspecified: Secondary | ICD-10-CM | POA: Diagnosis not present

## 2017-12-17 DIAGNOSIS — E663 Overweight: Secondary | ICD-10-CM | POA: Diagnosis not present

## 2017-12-25 ENCOUNTER — Ambulatory Visit: Payer: 59 | Admitting: Diagnostic Neuroimaging

## 2018-02-22 DIAGNOSIS — M5442 Lumbago with sciatica, left side: Secondary | ICD-10-CM | POA: Diagnosis not present

## 2018-02-26 ENCOUNTER — Other Ambulatory Visit: Payer: Self-pay | Admitting: Internal Medicine

## 2018-03-14 ENCOUNTER — Emergency Department (HOSPITAL_COMMUNITY)
Admission: EM | Admit: 2018-03-14 | Discharge: 2018-03-14 | Disposition: A | Discharge status: Home / Self Care | Payer: BLUE CROSS/BLUE SHIELD | Attending: Emergency Medicine | Admitting: Emergency Medicine

## 2018-03-14 ENCOUNTER — Emergency Department (HOSPITAL_COMMUNITY): Payer: BLUE CROSS/BLUE SHIELD

## 2018-03-14 ENCOUNTER — Other Ambulatory Visit: Payer: Self-pay

## 2018-03-14 ENCOUNTER — Encounter (HOSPITAL_COMMUNITY): Payer: Self-pay | Admitting: Emergency Medicine

## 2018-03-14 DIAGNOSIS — M5442 Lumbago with sciatica, left side: Secondary | ICD-10-CM | POA: Insufficient documentation

## 2018-03-14 DIAGNOSIS — M5441 Lumbago with sciatica, right side: Secondary | ICD-10-CM | POA: Diagnosis not present

## 2018-03-14 DIAGNOSIS — M545 Low back pain: Secondary | ICD-10-CM | POA: Diagnosis not present

## 2018-03-14 MED ORDER — HYDROCODONE-ACETAMINOPHEN 5-325 MG PO TABS
1.0000 | ORAL_TABLET | Freq: Once | ORAL | Status: AC
  Administered 2018-03-14: 1 via ORAL
  Filled 2018-03-14: qty 1

## 2018-03-14 MED ORDER — KETOROLAC TROMETHAMINE 15 MG/ML IJ SOLN
30.0000 mg | Freq: Once | INTRAMUSCULAR | Status: AC
Start: 2018-03-14 — End: 2018-03-14
  Administered 2018-03-14: 30 mg via INTRAMUSCULAR
  Filled 2018-03-14: qty 2

## 2018-03-14 MED ORDER — METHYLPREDNISOLONE ACETATE 80 MG/ML IJ SUSP
80.0000 mg | Freq: Once | INTRAMUSCULAR | Status: AC
  Administered 2018-03-14: 80 mg via INTRAMUSCULAR
  Filled 2018-03-14: qty 1

## 2018-03-14 MED ORDER — PREDNISONE 10 MG (21) PO TBPK: ORAL_TABLET | Freq: Every day | ORAL | 0 refills | Status: DC

## 2018-03-14 MED ORDER — MELOXICAM 7.5 MG PO TABS: 7.5000 mg | ORAL_TABLET | Freq: Every day | ORAL | 0 refills | Status: AC

## 2018-03-14 MED ORDER — ORPHENADRINE CITRATE ER 100 MG PO TB12: 100.0000 mg | ORAL_TABLET | Freq: Two times a day (BID) | ORAL | 0 refills | Status: AC

## 2018-03-14 NOTE — ED Triage Notes (Signed)
Pt complaint of continued left back pain, now radiating to right side. Onset a month ago.

## 2018-03-14 NOTE — ED Provider Notes (Signed)
[place] DEPT Provider Note   CSN: [id number] Arrival date & time: 03/14/18  1357     History   Chief Complaint Chief Complaint  Patient presents with  . Back Pain    HPI Valerie Reeves is a 42 y.o. female.  42 year old female presents with complaint of low back pain.  Patient states pain started on the left side, radiating into her left hip area 1 month ago, seen at PCP office for same and given prescription for prednisone, Flexeril, Ultram.  Patient has been taking the medication as prescribed without relief of her pain.  Patient developed worsening pain in her left lower back last night as well as now having pain in her right lower back and across low back area.  Pain is worse with trying to walk or bear weight especially on her left foot.  She denies weakness or numbness in her legs, groin numbness, loss of bowel or bladder control, abdominal pain.  Denies history of falls or injuries or prior back problems.  Patient is scheduled to see her PCP at the beginning of August to reevaluate and plan for physical therapy if not improving however due to increase in her pain called for an earlier appointment and was advised to come to the emergency room.  Patient has not taken anything for her symptoms today.  No other complaints or concerns.     Past Medical History:  Diagnosis Date  . Fatigue   . Mitral regurgitation, acute   . Palpitations   . Pilar cyst     There are no active problems to display for this patient.   Past Surgical History:  Procedure Laterality Date  . nerve decompressed  Left    leg   . OTHER SURGICAL HISTORY     hysterectomy 2006  . TONSILLECTOMY    . TUBAL LIGATION  1998     OB History   None      Home Medications    Prior to Admission medications   Medication Sig Start Date End Date Taking? Authorizing Provider  metoprolol succinate (TOPROL-XL) 25 MG 24 hr tablet Take one tablet by mouth daily, may  take additional tablet for palpitations if needed. Please make overdue appt with Dr. Harrington Challenger. 2nd attempt 02/26/18  Yes Fay Records, MD  Naltrexone-Bupropion HCl ER (CONTRAVE) 8-90 MG TB12 Take 2 tablets 2 (two) times daily by mouth.    Yes [provider]  naproxen sodium (ALEVE) 220 MG tablet Take 220 mg by mouth daily as needed (inflammation/pain of hand).    Yes [provider]  traZODone (DESYREL) 50 MG tablet Take 50 mg by mouth at bedtime as needed for sleep.   Yes [provider]  meloxicam (MOBIC) 7.5 MG tablet Take 1 tablet (7.5 mg total) by mouth daily for 7 days. 03/14/18 03/21/18  Tacy Learn, PA-C  orphenadrine (NORFLEX) 100 MG tablet Take 1 tablet (100 mg total) by mouth 2 (two) times daily for 10 days. 03/14/18 03/24/18  Tacy Learn, PA-C  predniSONE (STERAPRED UNI-PAK 21 TAB) 10 MG (21) TBPK tablet Take by mouth daily. Take 6 tabs by mouth daily  for 2 days, then 5 tabs for 2 days, then 4 tabs for 2 days, then 3 tabs for 2 days, 2 tabs for 2 days, then 1 tab by mouth daily for 2 days 03/14/18   Tacy Learn, PA-C    Family History Family History  Adopted: Yes  Problem Relation Age of  Onset  . Stroke Maternal Grandmother   . Heart attack Maternal Grandfather   . Hypertension Paternal Grandmother   . Diabetes Paternal Grandmother   . Stroke Paternal Grandmother   . Heart attack Paternal Grandfather   . Hypertension Paternal Grandfather   . Cancer - Lung Brother     Social History Social History   Tobacco Use  . Smoking status: Never Smoker  . Smokeless tobacco: Never Used  Substance Use Topics  . Alcohol use: No    Frequency: Never  . Drug use: No     Allergies   Penicillins; Gold-containing drug products; and Nickel   Review of Systems Review of Systems  Constitutional: Negative for fever.  Gastrointestinal: Negative for abdominal pain.  Genitourinary: Negative for difficulty urinating.  Musculoskeletal: Positive for back  pain and gait problem.  Skin: Negative for rash and wound.  Allergic/Immunologic: Negative for immunocompromised state.  Neurological: Negative for weakness and numbness.  Hematological: Does not bruise/bleed easily.  Psychiatric/Behavioral: Negative for confusion.  All other systems reviewed and are negative.    Physical Exam Updated Vital Signs BP 125/84 (BP Location: Left Arm)   Pulse 68   Temp 98.3 F (36.8 C) (Oral)   Resp 16   LMP 10/18/2012   SpO2 100%   Physical Exam  Constitutional: She is oriented to person, place, and time. She appears well-developed and well-nourished.  HENT:  Head: Normocephalic and atraumatic.  Cardiovascular: Intact distal pulses.  Pulmonary/Chest: Effort normal.  Abdominal: Soft. She exhibits no distension. There is no tenderness.  Musculoskeletal: She exhibits tenderness.       Thoracic back: She exhibits no tenderness and no bony tenderness.       Lumbar back: She exhibits decreased range of motion, tenderness and bony tenderness. She exhibits no deformity.       Back:  Tenderness left paraspinous lumbar spine, across L5 as well as left and right SI joints.  Straight leg raise positive, able to lift right leg to 45 degrees, left leg to 15.  Neurological: She is alert and oriented to person, place, and time. She has normal strength. She displays normal reflexes. No sensory deficit. She displays no Babinski's sign on the right side. She displays no Babinski's sign on the left side.  Reflex Scores:      Patellar reflexes are 2+ on the right side and 2+ on the left side. Skin: Skin is warm and dry. No rash noted.  Psychiatric: She has a normal mood and affect. Her behavior is normal.  Nursing note and vitals reviewed.    ED Treatments / Results  Labs (all labs ordered are listed, but only abnormal results are displayed) Labs Reviewed - No data to display  EKG None  Radiology Dg Lumbar Spine Complete  Result Date: 03/14/2018 CLINICAL  DATA:  Low back pain radiates to the right. EXAM: LUMBAR SPINE - COMPLETE 4+ VIEW COMPARISON:  None. FINDINGS: There is no evidence of lumbar spine fracture. Alignment is normal. Intervertebral disc spaces are maintained. IMPRESSION: Negative. Electronically Signed   By: Misty Stanley M.D.   On: 03/14/2018 15:12    Procedures Procedures (including critical care time)  Medications Ordered in ED Medications  methylPREDNISolone acetate (DEPO-MEDROL) injection 80 mg (has no administration in time range)  ketorolac (TORADOL) 15 MG/ML injection 30 mg (30 mg Intramuscular Given 03/14/18 1451)  HYDROcodone-acetaminophen (NORCO/VICODIN) 5-325 MG per tablet 1 tablet (1 tablet Oral Given 03/14/18 1451)     Initial Impression / Assessment and Plan /  ED Course  I have reviewed the triage vital signs and the nursing notes.  Pertinent labs & imaging results that were available during my care of the patient were reviewed by me and considered in my medical decision making (see chart for details).    Final Clinical Impressions(s) / ED Diagnoses   Final diagnoses:  Acute bilateral low back pain with bilateral sciatica    ED Discharge Orders        Ordered    predniSONE (STERAPRED UNI-PAK 21 TAB) 10 MG (21) TBPK tablet  Daily     03/14/18 1530    meloxicam (MOBIC) 7.5 MG tablet  Daily     03/14/18 1530    orphenadrine (NORFLEX) 100 MG tablet  2 times daily     03/14/18 1530       Tacy Learn, PA-C 03/14/18 1535    Julianne Rice, MD 03/15/18 [phone]

## 2018-03-14 NOTE — Discharge Instructions (Addendum)
Take Meloxicam and Norflex as needed for pain. Take Prednisone as prescribed and complete the full course. Gentle stretching as tolerated, warm compresses for 20 minuets at a time. Follow up with your PCP, return to ER for worsening or concerning symptoms.

## 2018-08-07 DIAGNOSIS — Z1231 Encounter for screening mammogram for malignant neoplasm of breast: Secondary | ICD-10-CM | POA: Diagnosis not present

## 2018-09-27 DIAGNOSIS — R0981 Nasal congestion: Secondary | ICD-10-CM | POA: Diagnosis not present

## 2018-09-27 DIAGNOSIS — Z7689 Persons encountering health services in other specified circumstances: Secondary | ICD-10-CM | POA: Diagnosis not present

## 2018-09-27 DIAGNOSIS — R928 Other abnormal and inconclusive findings on diagnostic imaging of breast: Secondary | ICD-10-CM | POA: Diagnosis not present

## 2018-10-17 DIAGNOSIS — R922 Inconclusive mammogram: Secondary | ICD-10-CM | POA: Diagnosis not present

## 2018-10-17 DIAGNOSIS — R928 Other abnormal and inconclusive findings on diagnostic imaging of breast: Secondary | ICD-10-CM | POA: Diagnosis not present

## 2018-11-11 DIAGNOSIS — D649 Anemia, unspecified: Secondary | ICD-10-CM | POA: Diagnosis not present

## 2018-11-11 DIAGNOSIS — R0981 Nasal congestion: Secondary | ICD-10-CM | POA: Diagnosis not present

## 2018-11-11 DIAGNOSIS — R413 Other amnesia: Secondary | ICD-10-CM | POA: Diagnosis not present

## 2018-11-11 DIAGNOSIS — R635 Abnormal weight gain: Secondary | ICD-10-CM | POA: Diagnosis not present

## 2018-11-14 DIAGNOSIS — R41840 Attention and concentration deficit: Secondary | ICD-10-CM | POA: Diagnosis not present

## 2018-11-14 DIAGNOSIS — R413 Other amnesia: Secondary | ICD-10-CM | POA: Diagnosis not present

## 2018-12-12 DIAGNOSIS — R413 Other amnesia: Secondary | ICD-10-CM | POA: Diagnosis not present

## 2018-12-12 DIAGNOSIS — R41840 Attention and concentration deficit: Secondary | ICD-10-CM | POA: Diagnosis not present

## 2018-12-24 DIAGNOSIS — L237 Allergic contact dermatitis due to plants, except food: Secondary | ICD-10-CM | POA: Diagnosis not present

## 2019-01-10 DIAGNOSIS — F988 Other specified behavioral and emotional disorders with onset usually occurring in childhood and adolescence: Secondary | ICD-10-CM | POA: Diagnosis not present

## 2019-01-10 DIAGNOSIS — R05 Cough: Secondary | ICD-10-CM | POA: Diagnosis not present

## 2019-01-10 DIAGNOSIS — R6889 Other general symptoms and signs: Secondary | ICD-10-CM | POA: Diagnosis not present

## 2019-02-03 DIAGNOSIS — J0110 Acute frontal sinusitis, unspecified: Secondary | ICD-10-CM | POA: Diagnosis not present

## 2019-02-03 DIAGNOSIS — R51 Headache: Secondary | ICD-10-CM | POA: Diagnosis not present

## 2019-02-03 DIAGNOSIS — F988 Other specified behavioral and emotional disorders with onset usually occurring in childhood and adolescence: Secondary | ICD-10-CM | POA: Diagnosis not present

## 2019-02-20 DIAGNOSIS — M62838 Other muscle spasm: Secondary | ICD-10-CM | POA: Diagnosis not present

## 2019-02-20 DIAGNOSIS — R51 Headache: Secondary | ICD-10-CM | POA: Diagnosis not present

## 2019-02-20 DIAGNOSIS — G479 Sleep disorder, unspecified: Secondary | ICD-10-CM | POA: Diagnosis not present

## 2019-03-23 DIAGNOSIS — R569 Unspecified convulsions: Secondary | ICD-10-CM | POA: Diagnosis not present

## 2019-03-23 DIAGNOSIS — R55 Syncope and collapse: Secondary | ICD-10-CM | POA: Diagnosis not present

## 2019-03-23 DIAGNOSIS — S0990XA Unspecified injury of head, initial encounter: Secondary | ICD-10-CM | POA: Diagnosis not present

## 2019-03-23 DIAGNOSIS — R079 Chest pain, unspecified: Secondary | ICD-10-CM | POA: Diagnosis not present

## 2019-03-23 DIAGNOSIS — T63414A Toxic effect of venom of centipedes and venomous millipedes, undetermined, initial encounter: Secondary | ICD-10-CM | POA: Diagnosis not present

## 2019-03-23 DIAGNOSIS — S069X1A Unspecified intracranial injury with loss of consciousness of 30 minutes or less, initial encounter: Secondary | ICD-10-CM | POA: Diagnosis not present

## 2019-03-24 DIAGNOSIS — R112 Nausea with vomiting, unspecified: Secondary | ICD-10-CM | POA: Diagnosis not present

## 2019-03-24 DIAGNOSIS — S060X1A Concussion with loss of consciousness of 30 minutes or less, initial encounter: Secondary | ICD-10-CM | POA: Diagnosis not present

## 2019-03-24 DIAGNOSIS — R079 Chest pain, unspecified: Secondary | ICD-10-CM | POA: Diagnosis not present

## 2019-03-25 DIAGNOSIS — R51 Headache: Secondary | ICD-10-CM | POA: Diagnosis not present

## 2019-03-25 DIAGNOSIS — Z20828 Contact with and (suspected) exposure to other viral communicable diseases: Secondary | ICD-10-CM | POA: Diagnosis not present

## 2019-03-27 DIAGNOSIS — R51 Headache: Secondary | ICD-10-CM | POA: Diagnosis not present

## 2019-03-27 DIAGNOSIS — L255 Unspecified contact dermatitis due to plants, except food: Secondary | ICD-10-CM | POA: Diagnosis not present

## 2019-03-27 DIAGNOSIS — S060X1A Concussion with loss of consciousness of 30 minutes or less, initial encounter: Secondary | ICD-10-CM | POA: Diagnosis not present

## 2019-04-14 ENCOUNTER — Encounter (HOSPITAL_COMMUNITY): Payer: Self-pay | Admitting: Emergency Medicine

## 2019-04-14 ENCOUNTER — Emergency Department (HOSPITAL_COMMUNITY): Payer: BC Managed Care – PPO

## 2019-04-14 ENCOUNTER — Other Ambulatory Visit: Payer: Self-pay

## 2019-04-14 ENCOUNTER — Other Ambulatory Visit (HOSPITAL_COMMUNITY): Payer: Self-pay

## 2019-04-14 ENCOUNTER — Observation Stay (HOSPITAL_COMMUNITY)
Admission: EM | Admit: 2019-04-14 | Discharge: 2019-04-15 | Disposition: A | Discharge status: Home / Self Care | Payer: BC Managed Care – PPO | Attending: Family Medicine | Admitting: Family Medicine

## 2019-04-14 DIAGNOSIS — Z880 Allergy status to penicillin: Secondary | ICD-10-CM | POA: Insufficient documentation

## 2019-04-14 DIAGNOSIS — R079 Chest pain, unspecified: Secondary | ICD-10-CM | POA: Diagnosis not present

## 2019-04-14 DIAGNOSIS — F909 Attention-deficit hyperactivity disorder, unspecified type: Secondary | ICD-10-CM | POA: Diagnosis not present

## 2019-04-14 DIAGNOSIS — Z79899 Other long term (current) drug therapy: Secondary | ICD-10-CM | POA: Insufficient documentation

## 2019-04-14 DIAGNOSIS — Z1159 Encounter for screening for other viral diseases: Secondary | ICD-10-CM | POA: Insufficient documentation

## 2019-04-14 DIAGNOSIS — R55 Syncope and collapse: Secondary | ICD-10-CM | POA: Diagnosis not present

## 2019-04-14 DIAGNOSIS — R0789 Other chest pain: Secondary | ICD-10-CM | POA: Diagnosis not present

## 2019-04-14 DIAGNOSIS — R51 Headache: Secondary | ICD-10-CM | POA: Insufficient documentation

## 2019-04-14 LAB — BASIC METABOLIC PANEL
Anion gap: 8 (ref 5–15)
BUN: 7 mg/dL (ref 6–20)
CO2: 27 mmol/L (ref 22–32)
Calcium: 9.1 mg/dL (ref 8.9–10.3)
Chloride: 105 mmol/L (ref 98–111)
Creatinine, Ser: 0.58 mg/dL (ref 0.44–1.00)
GFR calc Af Amer: >60 mL/min (ref >60)
GFR calc non Af Amer: >60 mL/min (ref >60)
Glucose, Bld: 80 mg/dL (ref 70–99)
Potassium: 3.8 mmol/L (ref 3.5–5.1)
Sodium: 140 mmol/L (ref 135–145)

## 2019-04-14 LAB — TROPONIN I (HIGH SENSITIVITY)
Troponin I (High Sensitivity): <2 ng/L (ref <18)
Troponin I (High Sensitivity): 2 ng/L (ref <18)

## 2019-04-14 LAB — CBC
HCT: 42 % (ref 36.0–46.0)
Hemoglobin: 13.4 g/dL (ref 12.0–15.0)
MCH: 31.2 pg (ref 26.0–34.0)
MCHC: 31.9 g/dL (ref 30.0–36.0)
MCV: 97.9 fL (ref 80.0–100.0)
Platelets: 279 10*3/uL (ref 150–400)
RBC: 4.29 MIL/uL (ref 3.87–5.11)
RDW: 12.6 % (ref 11.5–15.5)
WBC: 7.6 10*3/uL (ref 4.0–10.5)
nRBC: 0 % (ref 0.0–0.2)

## 2019-04-14 LAB — SARS CORONAVIRUS 2 BY RT PCR (HOSPITAL ORDER, PERFORMED IN ~~LOC~~ HOSPITAL LAB): SARS Coronavirus 2: NEGATIVE

## 2019-04-14 LAB — HCG, QUANTITATIVE, PREGNANCY: hCG, Beta Chain, Quant, S: <1 m[IU]/mL (ref <5)

## 2019-04-14 MED ORDER — TRAZODONE HCL 50 MG PO TABS: 50.0000 mg | ORAL_TABLET | Freq: Every evening | ORAL | Status: DC | PRN

## 2019-04-14 MED ORDER — ENOXAPARIN SODIUM 40 MG/0.4ML ~~LOC~~ SOLN
40.0000 mg | SUBCUTANEOUS | Status: DC
  Administered 2019-04-15: 40 mg via SUBCUTANEOUS
  Filled 2019-04-14: qty 0.4

## 2019-04-14 MED ORDER — METHYLPHENIDATE HCL ER 18 MG PO TB24
54.0000 mg | ORAL_TABLET | Freq: Every day | ORAL | Status: DC
  Administered 2019-04-15: 54 mg via ORAL
  Filled 2019-04-14: qty 3

## 2019-04-14 MED ORDER — CYCLOBENZAPRINE HCL 10 MG PO TABS
5.0000 mg | ORAL_TABLET | Freq: Three times a day (TID) | ORAL | Status: DC | PRN
  Administered 2019-04-15: 10 mg via ORAL
  Filled 2019-04-14: qty 1

## 2019-04-14 MED ORDER — ACETAMINOPHEN 650 MG RE SUPP: 650.0000 mg | Freq: Four times a day (QID) | RECTAL | Status: DC | PRN

## 2019-04-14 MED ORDER — ACETAMINOPHEN 325 MG PO TABS
650.0000 mg | ORAL_TABLET | Freq: Four times a day (QID) | ORAL | Status: DC | PRN
  Administered 2019-04-15 (×2): 650 mg via ORAL
  Filled 2019-04-14 (×2): qty 2

## 2019-04-14 NOTE — ED Notes (Signed)
ED Provider at bedside. 
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
## 2019-04-14 NOTE — ED Notes (Signed)
Report given to [place], [place]
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
## 2019-04-15 ENCOUNTER — Other Ambulatory Visit: Payer: Self-pay | Admitting: Physician Assistant

## 2019-04-15 ENCOUNTER — Observation Stay (HOSPITAL_BASED_OUTPATIENT_CLINIC_OR_DEPARTMENT_OTHER): Payer: BC Managed Care – PPO

## 2019-04-15 ENCOUNTER — Observation Stay (HOSPITAL_COMMUNITY)
Admit: 2019-04-15 | Discharge: 2019-04-15 | Disposition: A | Discharge status: Home / Self Care | Payer: BC Managed Care – PPO | Attending: Internal Medicine | Admitting: Internal Medicine

## 2019-04-15 ENCOUNTER — Observation Stay (HOSPITAL_COMMUNITY): Payer: BC Managed Care – PPO

## 2019-04-15 ENCOUNTER — Other Ambulatory Visit: Payer: Self-pay

## 2019-04-15 DIAGNOSIS — R55 Syncope and collapse: Secondary | ICD-10-CM

## 2019-04-15 DIAGNOSIS — R0789 Other chest pain: Secondary | ICD-10-CM | POA: Diagnosis not present

## 2019-04-15 DIAGNOSIS — R4020 Unspecified coma: Secondary | ICD-10-CM | POA: Diagnosis not present

## 2019-04-15 DIAGNOSIS — R569 Unspecified convulsions: Secondary | ICD-10-CM

## 2019-04-15 LAB — CBC WITH DIFFERENTIAL/PLATELET
Abs Immature Granulocytes: 0.01 10*3/uL (ref 0.00–0.07)
Basophils Absolute: 0 10*3/uL (ref 0.0–0.1)
Basophils Relative: 1 %
Eosinophils Absolute: 0.1 10*3/uL (ref 0.0–0.5)
Eosinophils Relative: 2 %
HCT: 39 % (ref 36.0–46.0)
Hemoglobin: 12.6 g/dL (ref 12.0–15.0)
Immature Granulocytes: 0 %
Lymphocytes Relative: 36 %
Lymphs Abs: 2.2 10*3/uL (ref 0.7–4.0)
MCH: 31.5 pg (ref 26.0–34.0)
MCHC: 32.3 g/dL (ref 30.0–36.0)
MCV: 97.5 fL (ref 80.0–100.0)
Monocytes Absolute: 0.5 10*3/uL (ref 0.1–1.0)
Monocytes Relative: 8 %
Neutro Abs: 3.4 10*3/uL (ref 1.7–7.7)
Neutrophils Relative %: 53 %
Platelets: 240 10*3/uL (ref 150–400)
RBC: 4 MIL/uL (ref 3.87–5.11)
RDW: 12.6 % (ref 11.5–15.5)
WBC: 6.2 10*3/uL (ref 4.0–10.5)
nRBC: 0 % (ref 0.0–0.2)

## 2019-04-15 LAB — RAPID URINE DRUG SCREEN, HOSP PERFORMED
Amphetamines: NOT DETECTED
Barbiturates: NOT DETECTED
Benzodiazepines: NOT DETECTED
Cocaine: NOT DETECTED
Opiates: NOT DETECTED
Tetrahydrocannabinol: NOT DETECTED

## 2019-04-15 LAB — COMPREHENSIVE METABOLIC PANEL
ALT: 13 U/L (ref 0–44)
AST: 13 U/L — ABNORMAL LOW (ref 15–41)
Albumin: 3.9 g/dL (ref 3.5–5.0)
Alkaline Phosphatase: 44 U/L (ref 38–126)
Anion gap: 7 (ref 5–15)
BUN: 8 mg/dL (ref 6–20)
CO2: 24 mmol/L (ref 22–32)
Calcium: 8.9 mg/dL (ref 8.9–10.3)
Chloride: 107 mmol/L (ref 98–111)
Creatinine, Ser: 0.59 mg/dL (ref 0.44–1.00)
GFR calc Af Amer: >60 mL/min (ref >60)
GFR calc non Af Amer: >60 mL/min (ref >60)
Glucose, Bld: 106 mg/dL — ABNORMAL HIGH (ref 70–99)
Potassium: 3.5 mmol/L (ref 3.5–5.1)
Sodium: 138 mmol/L (ref 135–145)
Total Bilirubin: 0.6 mg/dL (ref 0.3–1.2)
Total Protein: 6.2 g/dL — ABNORMAL LOW (ref 6.5–8.1)

## 2019-04-15 LAB — TSH: TSH: 4.476 u[IU]/mL (ref 0.350–4.500)

## 2019-04-15 LAB — D-DIMER, QUANTITATIVE: D-Dimer, Quant: 0.34 ug/mL-FEU (ref 0.00–0.50)

## 2019-04-15 LAB — HIV ANTIBODY (ROUTINE TESTING W REFLEX): HIV Screen 4th Generation wRfx: NONREACTIVE

## 2019-04-15 LAB — ECHOCARDIOGRAM COMPLETE
Height: 67 in
Weight: 2850.11 oz

## 2019-04-15 MED ORDER — GADOBUTROL 1 MMOL/ML IV SOLN: 8.0000 mL | Freq: Once | INTRAVENOUS | Status: DC | PRN

## 2019-04-15 MED ORDER — DIPHENHYDRAMINE HCL 50 MG/ML IJ SOLN
25.0000 mg | Freq: Once | INTRAMUSCULAR | Status: AC
  Administered 2019-04-15: 25 mg via INTRAVENOUS
  Filled 2019-04-15: qty 1

## 2019-04-15 MED ORDER — VALPROATE SODIUM 500 MG/5ML IV SOLN: 500.0000 mg | Freq: Two times a day (BID) | INTRAVENOUS | Status: DC

## 2019-04-15 MED ORDER — KETOROLAC TROMETHAMINE 30 MG/ML IJ SOLN
30.0000 mg | Freq: Once | INTRAMUSCULAR | Status: AC
  Administered 2019-04-15: 30 mg via INTRAVENOUS
  Filled 2019-04-15: qty 1

## 2019-04-15 MED ORDER — PROCHLORPERAZINE EDISYLATE 10 MG/2ML IJ SOLN
10.0000 mg | Freq: Once | INTRAMUSCULAR | Status: AC
  Administered 2019-04-15: 10 mg via INTRAVENOUS
  Filled 2019-04-15: qty 2

## 2019-04-15 MED ORDER — DIVALPROEX SODIUM 500 MG PO DR TAB: 500.0000 mg | DELAYED_RELEASE_TABLET | Freq: Two times a day (BID) | ORAL | 1 refills | Status: AC

## 2019-04-15 MED ORDER — VALPROATE SODIUM 500 MG/5ML IV SOLN
1000.0000 mg | Freq: Once | INTRAVENOUS | Status: AC
  Administered 2019-04-15: 1000 mg via INTRAVENOUS
  Filled 2019-04-15: qty 10

## 2019-04-15 NOTE — Plan of Care (Addendum)
Pt had a good appetite this shift. No C/O pain. A&O X 4.
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
## 2019-04-15 NOTE — Progress Notes (Signed)
EEG complete - results pending 
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
## 2019-04-15 NOTE — Progress Notes (Addendum)
Pharmacy - Medication Consult  Assessment: Pharmacy requested to review medications in 1 yoF with multiple episodes of syncope related to possible seizures. Consideration given to QTc/cardiac source of syncope. PTA medications below:  Medications Prior to Admission  Medication Sig Dispense Refill  . cyclobenzaprine (FLEXERIL) 5 MG tablet Take 5-10 mg by mouth 3 (three) times daily as needed for muscle spasms.    . methylphenidate (RITALIN LA) 30 MG 24 hr capsule Take 60 mg by mouth daily.    . promethazine (PHENERGAN) 25 MG tablet Take 25 mg by mouth every 6 (six) hours as needed for nausea or vomiting.     . traMADol (ULTRAM) 50 MG tablet Take 50-100 mg by mouth every 8 (eight) hours as needed for moderate pain.     . traZODone (DESYREL) 50 MG tablet Take 50 mg by mouth at bedtime as needed for sleep.    . metoprolol succinate (TOPROL-XL) 25 MG 24 hr tablet Take one tablet by mouth daily, may take additional tablet for palpitations if needed. Please make overdue appt with Dr. Harrington Challenger. 2nd attempt (Patient not taking: Reported on 04/14/2019) 30 tablet 0  . predniSONE (STERAPRED UNI-PAK 21 TAB) 10 MG (21) TBPK tablet Take by mouth daily. Take 6 tabs by mouth daily  for 2 days, then 5 tabs for 2 days, then 4 tabs for 2 days, then 3 tabs for 2 days, 2 tabs for 2 days, then 1 tab by mouth daily for 2 days (Patient not taking: Reported on 04/14/2019) 42 tablet 0    Assessment:  Tramadol, trazodone, phenergan may prolong QT interval  Tramadol, cyclobenzaprine, methylphenidate may lower seizure threshold  In particular, the pro-epileptic effects of tramadol can be potentiated by other serotonergic agents, and also increases the risk of serotonin syndrome (which can cause myoclonus)  Recommendation:  Would consider alternatives to tramadol and trazodone  If follow-up EKG monitoring reveals QT concerns, question if phenergan could be stopped  Could consider replacing other QT or pro-epileptic  medications if the above steps do not resolve the problem  Reuel Boom, PharmD, BCPS [phone] 04/15/2019, 2:55 PM

## 2019-04-15 NOTE — Progress Notes (Signed)
  Echocardiogram 2D Echocardiogram has been performed.  Valerie Reeves 04/15/2019, 9:50 AM

## 2019-04-15 NOTE — Progress Notes (Signed)
Discharge paperwork instructions discussed with patient and patient's husband at the bedside. Paperwork given to the patient. IV site removed. Telemetry removed. Patient's husband to transport patient. Escorted patient to the lobby with patient belongings.
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
## 2019-04-15 NOTE — Procedures (Signed)
Patient Name: Valerie Reeves  MRN: [id number]  Epilepsy Attending: Lora Havens  Referring Physician/Provider: Dr Gean Birchwood Date: 04/15/2019 Duration: 24.46 mins  Patient history: 43 year old female with history of loss of consciousness and seizure-like activity.  EEG ordered to evaluate for seizures.  Level of alertness: Awake, asleep  Technical aspects: This EEG study was done with scalp electrodes positioned according to the 10-20 International system of electrode placement. Electrical activity was acquired at a sampling rate of 500Hz  and reviewed with a high frequency filter of 70Hz  and a low frequency filter of 1Hz . EEG data were recorded continuously and digitally stored.   Description: During the awake state, no clear posterior dominant rhythm was seen.  There was an excessive amount of 15 to 18 Hz, 2-3 uV beta activity with irregular morphology distributed symmetrically and diffusely.   Sleep was characterized by vertex waves, sleep spindles (12- 14 Hz), maximal in frontocentral. Hyperventilation and photic stimulation were not performed.  IMPRESSION: This study is within normal limits. No seizures or epileptiform discharges were seen throughout the recording.  The excessive beta activity seen in the background is most likely due to the effect of benzodiazepine and is usually a benign EEG pattern.  [name] [name]

## 2019-04-16 ENCOUNTER — Telehealth: Payer: Self-pay | Admitting: Radiology

## 2019-04-16 NOTE — Telephone Encounter (Signed)
Enrolled patient for a 30 Day [employer] event monitor to be mailed. Brief instructions were gone over with the patient and she knows to expect the monitor to arrive in 3-4 days

## 2019-04-16 NOTE — Telephone Encounter (Signed)
Attempted to reach patient to verify info and briefly go over monitor instructions before having her monitor mailed.

## 2019-04-23 ENCOUNTER — Ambulatory Visit (INDEPENDENT_AMBULATORY_CARE_PROVIDER_SITE_OTHER): Payer: BC Managed Care – PPO

## 2019-04-23 DIAGNOSIS — R55 Syncope and collapse: Secondary | ICD-10-CM | POA: Diagnosis not present

## 2019-04-28 ENCOUNTER — Telehealth: Payer: Self-pay | Admitting: *Deleted

## 2019-04-28 NOTE — Telephone Encounter (Signed)
Lm re critical event from [employer] recording for call back ./cy

## 2019-05-27 ENCOUNTER — Other Ambulatory Visit: Payer: Self-pay

## 2019-06-02 NOTE — Telephone Encounter (Signed)
See completed monitor results ./cy

## 2019-06-03 ENCOUNTER — Telehealth: Payer: Self-pay | Admitting: *Deleted

## 2019-06-03 NOTE — Telephone Encounter (Signed)
Left pt a message re: appt 06/06/2019.  Need to see if pt can do office visit since Dr. Harrington Challenger is on site.
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
## 2019-06-06 ENCOUNTER — Ambulatory Visit (INDEPENDENT_AMBULATORY_CARE_PROVIDER_SITE_OTHER): Payer: BC Managed Care – PPO | Admitting: Internal Medicine

## 2019-06-06 ENCOUNTER — Encounter: Payer: Self-pay | Admitting: Internal Medicine

## 2019-06-06 ENCOUNTER — Other Ambulatory Visit: Payer: Self-pay

## 2019-06-06 VITALS — BP 122/62 | HR 72 | Ht 67.0 in | Wt 183.6 lb

## 2019-06-06 DIAGNOSIS — R55 Syncope and collapse: Secondary | ICD-10-CM | POA: Diagnosis not present

## 2019-06-06 LAB — URINALYSIS
Bilirubin, UA: NEGATIVE
Glucose, UA: NEGATIVE
Ketones, UA: NEGATIVE
Leukocytes,UA: NEGATIVE
Nitrite, UA: NEGATIVE
Protein,UA: NEGATIVE
RBC, UA: NEGATIVE
Specific Gravity, UA: 1.007 (ref 1.005–1.030)
Urobilinogen, Ur: 0.2 mg/dL (ref 0.2–1.0)
pH, UA: 7.5 (ref 5.0–7.5)

## 2019-06-06 MED ORDER — PROPRANOLOL HCL 20 MG PO TABS: 10.0000 mg | ORAL_TABLET | Freq: Two times a day (BID) | ORAL | 6 refills | Status: DC

## 2019-06-06 NOTE — Patient Instructions (Addendum)
Medication Instructions:  Please start Propranolol 20 mg tablets - take 1/2 tablet twice a day.   Do not take Metoprolol.   Continue all other medications as listed.  Please check your blood pressure at home.  If you need a refill on your cardiac medications before your next appointment, please call your pharmacy.   Lab work: Please have a urinalysis today. If you have labs (blood work) drawn today and your tests are completely normal, you will receive your results only by: Marland Kitchen MyChart Message (if you have MyChart) OR . A paper copy in the mail If you have any lab test that is abnormal or we need to change your treatment, we will call you to review the results.  Please increase your sodium intake and fluid intake to remain well hydrated.  Follow-Up: At [facility], you and your health needs are our priority.  As part of our continuing mission to provide you with exceptional heart care, we have created designated Provider Care Teams.  These Care Teams include your primary Cardiologist (physician) and Advanced Practice Providers (APPs -  Physician Assistants and Nurse Practitioners) who all work together to provide you with the care you need, when you need it. You will need a follow up appointment in:  6 weeks.  Please call our office 2 months in advance to schedule this appointment.  You may see Dorris Carnes, MD or one of the following Advanced Practice Providers on your designated Care Team: Richardson Dopp, PA-C [place], [place] . Daune Perch, NP  Thank you for choosing [facility]!!      Orthostatic Hypotension Blood pressure is a measurement of how strongly, or weakly, your blood is pressing against the walls of your arteries. Orthostatic hypotension is a sudden drop in blood pressure that happens when you quickly change positions, such as when you get up from sitting or lying down. Arteries are blood vessels that carry blood from your heart throughout your body. When blood  pressure is too low, you may not get enough blood to your brain or to the rest of your organs. This can cause weakness, light-headedness, rapid heartbeat, and fainting. This can last for just a few seconds or for up to a few minutes. Orthostatic hypotension is usually not a serious problem. However, if it happens frequently or gets worse, it may be a sign of something more serious. What are the causes? This condition may be caused by:  Sudden changes in posture, such as standing up quickly after you have been sitting or lying down.  Blood loss.  Loss of body fluids (dehydration).  Heart problems.  Hormone (endocrine) problems.  Pregnancy.  Severe infection.  Lack of certain nutrients.  Severe allergic reactions (anaphylaxis).  Certain medicines, such as blood pressure medicine or medicines that make the body lose excess fluids (diuretics). Sometimes, this condition can be caused by not taking medicine as directed, such as taking too much of a certain medicine. What increases the risk? The following factors may make you more likely to develop this condition:  Age. Risk increases as you get older.  Conditions that affect the heart or the central nervous system.  Taking certain medicines, such as blood pressure medicine or diuretics.  Being pregnant. What are the signs or symptoms? Symptoms of this condition may include:  Weakness.  Light-headedness.  Dizziness.  Blurred vision.  Fatigue.  Rapid heartbeat.  Fainting, in severe cases. How is this diagnosed? This condition is diagnosed based on:  Your  medical history.  Your symptoms.  Your blood pressure measurement. Your health care provider will check your blood pressure when you are: ? Lying down. ? Sitting. ? Standing. A blood pressure reading is recorded as two numbers, such as "120 over 80" (or 120/80). The first ("top") number is called the systolic pressure. It is a measure of the pressure in your  arteries as your heart beats. The second ("bottom") number is called the diastolic pressure. It is a measure of the pressure in your arteries when your heart relaxes between beats. Blood pressure is measured in a unit called mm Hg. Healthy blood pressure for most adults is 120/80. If your blood pressure is below 90/60, you may be diagnosed with hypotension. Other information or tests that may be used to diagnose orthostatic hypotension include:  Your other vital signs, such as your heart rate and temperature.  Blood tests.  Tilt table test. For this test, you will be safely secured to a table that moves you from a lying position to an upright position. Your heart rhythm and blood pressure will be monitored during the test. How is this treated? This condition may be treated by:  Changing your diet. This may involve eating more salt (sodium) or drinking more water.  Taking medicines to raise your blood pressure.  Changing the dosage of certain medicines you are taking that might be lowering your blood pressure.  Wearing compression stockings. These stockings help to prevent blood clots and reduce swelling in your legs. In some cases, you may need to go to the hospital for:  Fluid replacement. This means you will receive fluids through an IV.  Blood replacement. This means you will receive donated blood through an IV (transfusion).  Treating an infection or heart problems, if this applies.  Monitoring. You may need to be monitored while medicines that you are taking wear off. Follow these instructions at home: Eating and drinking   Drink enough fluid to keep your urine pale yellow.  Eat a healthy diet, and follow instructions from your health care provider about eating or drinking restrictions. A healthy diet includes: ? Fresh fruits and vegetables. ? Whole grains. ? Lean meats. ? Low-fat dairy products.  Eat extra salt only as directed. Do not add extra salt to your diet unless  your health care provider told you to do that.  Eat frequent, small meals.  Avoid standing up suddenly after eating. Medicines  Take over-the-counter and prescription medicines only as told by your health care provider. ? Follow instructions from your health care provider about changing the dosage of your current medicines, if this applies. ? Do not stop or adjust any of your medicines on your own. General instructions   Wear compression stockings as told by your health care provider.  Get up slowly from lying down or sitting positions. This gives your blood pressure a chance to adjust.  Avoid hot showers and excessive heat as directed by your health care provider.  Return to your normal activities as told by your health care provider. Ask your health care provider what activities are safe for you.  Do not use any products that contain nicotine or tobacco, such as cigarettes, e-cigarettes, and chewing tobacco. If you need help quitting, ask your health care provider.  Keep all follow-up visits as told by your health care provider. This is important. Contact a health care provider if you:  Vomit.  Have diarrhea.  Have a fever for more than 2-3 days.  Feel  more thirsty than usual.  Feel weak and tired. Get help right away if you:  Have chest pain.  Have a fast or irregular heartbeat.  Develop numbness in any part of your body.  Cannot move your arms or your legs.  Have trouble speaking.  Become sweaty or feel light-headed.  Faint.  Feel short of breath.  Have trouble staying awake.  Feel confused. Summary  Orthostatic hypotension is a sudden drop in blood pressure that happens when you quickly change positions.  Orthostatic hypotension is usually not a serious problem.  It is diagnosed by having your blood pressure taken lying down, sitting, and then standing.  It may be treated by changing your diet or adjusting your medicines. This information is not  intended to replace advice given to you by your health care provider. Make sure you discuss any questions you have with your health care provider. Document Released: 08/11/2002 Document Revised: 02/14/2018 Document Reviewed: 02/14/2018 Elsevier Patient Education  2020 [employer].

## 2019-07-20 NOTE — Progress Notes (Deleted)
Cardiology Office Note   Date:  07/20/2019   ID:  Valerie Reeves, DOB 06-22-1976, MRN [id number]  PCP:  Mancel Bale, PA-C  Cardiologist:   Dorris Carnes, MD   F/U of syncope      History of Present Illness: Valerie Reeves is a 43 y.o. female with a history of mitral regurg and chest pressure.   I saw her in 2018  Echo LVEF normal with triv MR    The patient was hospitalized in August after a spell in August while driving of chest pressure radiating to arm.  She felt lightheaded and then passed out   Echo normla   Trop neg    After discharge she was set up for an event monitor   Monitor showed SR with PACs    Reported episode of syncope while wearing that was associated with SR    She still has spells of pressure    Pain radiateding to L arm   NOt assocaited with activity   Has had couple syncope since   I saw the pt in Oct 2020     No outpatient medications have been marked as taking for the 07/21/19 encounter (Appointment) with Valerie Records, MD.     Allergies:   Bee venom, Penicillins, Gold-containing drug products, and Nickel   Past Medical History:  Diagnosis Date  . Fatigue   . Mitral regurgitation, acute   . Palpitations   . Pilar cyst     Past Surgical History:  Procedure Laterality Date  . nerve decompressed  Left    leg   . OTHER SURGICAL HISTORY     hysterectomy 2006  . TONSILLECTOMY    . TUBAL LIGATION  1998     Social History:  The patient  reports that she has never smoked. She has never used smokeless tobacco. She reports that she does not drink alcohol or use drugs.   Family History:  The patient's family history includes Cancer - Lung in her brother; Diabetes in her paternal grandmother; Heart attack in her maternal grandfather and paternal grandfather; Hypertension in her paternal grandfather and paternal grandmother; Stroke in her maternal grandmother and paternal grandmother. She was adopted.    ROS:  Please see the  history of present illness. All other systems are reviewed and  Negative to the above problem except as noted.    PHYSICAL EXAM: VS:  LMP 10/18/2012    Orthostatic:  BP laying 123/82  P 73 (has chest pressure) Sitting  120/81  P 71   Sees stars Standing   BP 134/86   P 88 Standing 3 min    144/96   P 92   No symptoms  GEN: Overweight 43 yo, in no acute distress  HEENT: normal  Neck: no JVD, carotid bruits  Cardiac: RRR; no murmurs, rubs, or gallops,no edema  Respiratory:  clear to auscultation bilaterally, normal work of breathing GI: soft, nontender, nondistended, + BS  No hepatomegaly  MS: no deformity Moving all extremities   Skin: warm and dry, no rash Neuro:  Strength and sensation are intact Psych: euthymic mood, full affect   EKG:  EKG is not ordered today.    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    Pt is alos followed in neuro   Continue on meds for possible seizures    2  Chest pressure   As noted above  ? Related to BP drop       2  Mitral regurg  Trivial on echo   No murmur   3  Palpitations   Continue b blocker    F?U in 6 wks       Current medicines are reviewed at length with the patient today.  The patient does not have concerns regarding medicines.  Signed, Dorris Carnes, MD  07/20/2019 8:21 PM    [facility] [address], [place], [place]  [address] Phone: [phone]; Fax: [phone]

## 2019-07-21 ENCOUNTER — Ambulatory Visit: Payer: BC Managed Care – PPO | Admitting: Internal Medicine

## 2019-07-24 ENCOUNTER — Other Ambulatory Visit: Payer: Self-pay | Admitting: Internal Medicine

## 2019-07-24 MED ORDER — PROPRANOLOL HCL 20 MG PO TABS: 10.0000 mg | ORAL_TABLET | Freq: Two times a day (BID) | ORAL | 3 refills | Status: DC

## 2019-08-17 NOTE — Progress Notes (Deleted)
No show

## 2019-08-18 ENCOUNTER — Ambulatory Visit: Payer: BC Managed Care – PPO | Admitting: Internal Medicine

## 2019-11-27 ENCOUNTER — Ambulatory Visit: Payer: BC Managed Care – PPO | Admitting: Internal Medicine

## 2020-03-01 IMAGING — MR MRI HEAD WITHOUT CONTRAST
10 of 11 series · 38 of 48 positions shown · non-contrast
Comparison: MRI the head without and with contrast 07/24/18

CLINICAL DATA: Seizure like activity.

EXAM:
MRI HEAD WITHOUT CONTRAST
TECHNIQUE: Multiplanar, multiecho pulse sequences of the brain and surrounding
structures were obtained without intravenous contrast.

[Series 3: T1 · sagittal · 5.0mm · 0.47mm/px · 2 of 20 slices shown (1 of 2)]
[im 1/20]
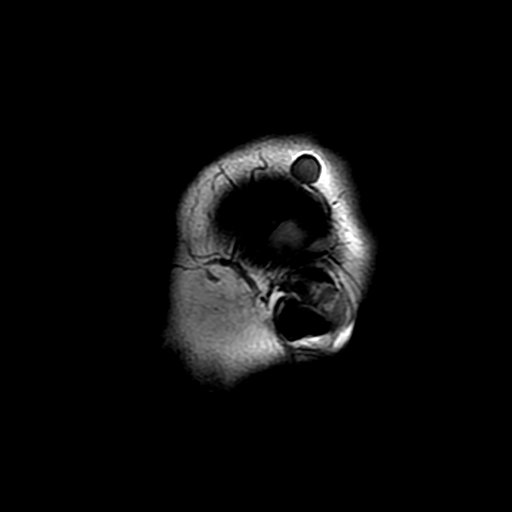
[im 20/20]
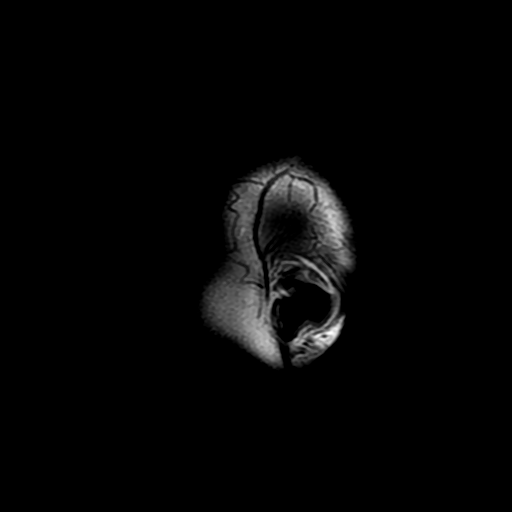

[Series 4: DWI · axial · 3.0mm · 1.09mm/px · z∈[-60,+69]mm · 8 of 90 slices shown (1 of 4)]
[im 1/90]
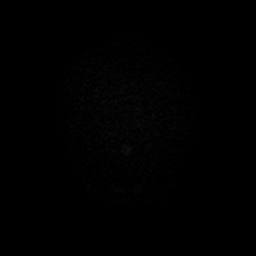
[im 13/90]
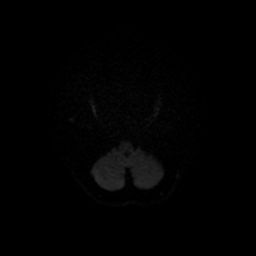
[im 26/90]
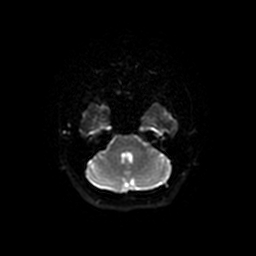
[im 39/90]
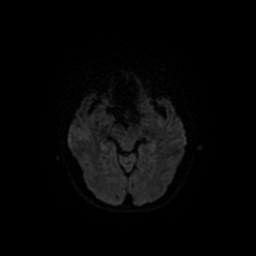
[im 51/90]
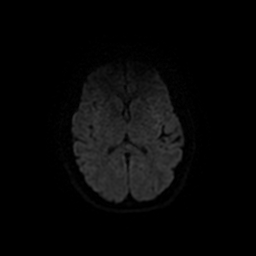
[im 64/90]
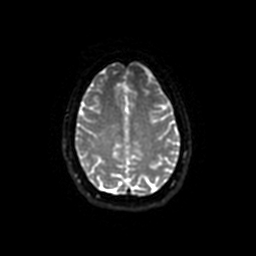
[im 77/90]
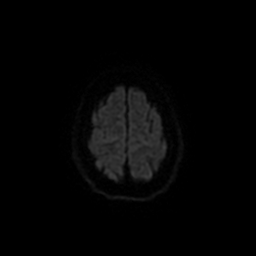
[im 90/90]
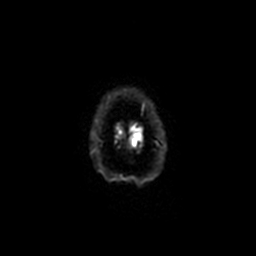

[Series 5: T2 · axial · 5.0mm · 0.86mm/px · z∈[-53,+76]mm · 2 of 20 slices shown (1 of 2)]
[im 1/20]
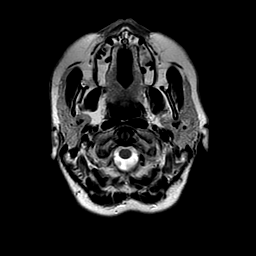
[im 20/20]
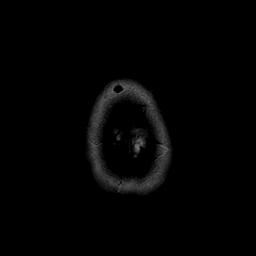

[Series 6: FLAIR · axial · 3.0mm · 0.43mm/px · z∈[-56,+79]mm · 2 of 24 slices shown (1 of 2)]
[im 1/24]
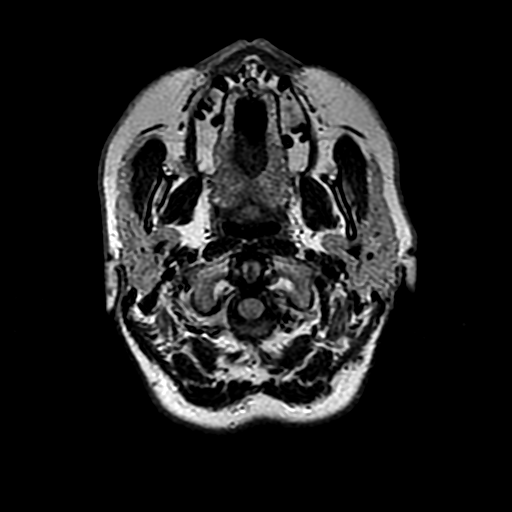
[im 24/24]
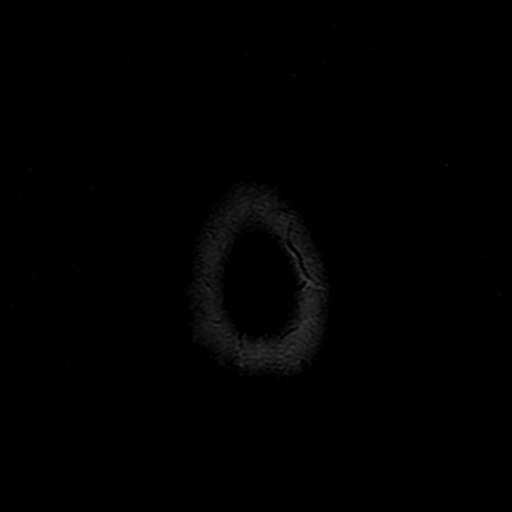

[Series 8: T1 · axial · 1.0mm · 0.47mm/px · z∈[-54,+3]mm · 4 of 130 slices shown (2 of 2)]
[im 1/130]
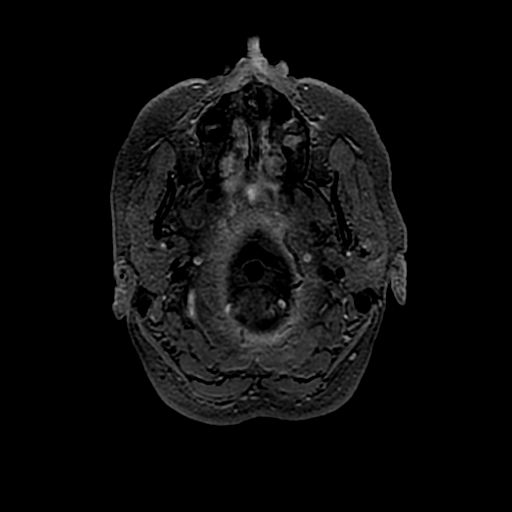
[im 24/130]
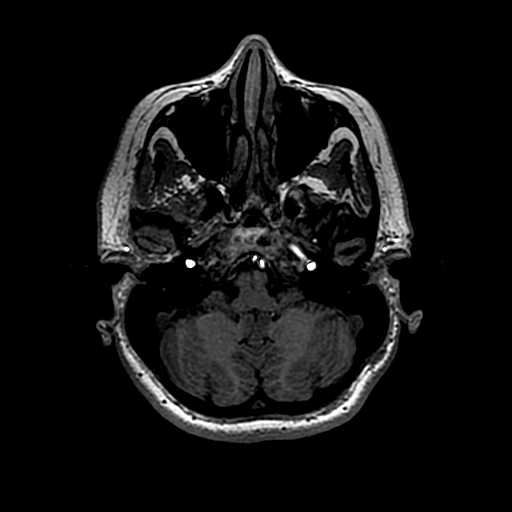
[im 36/130]
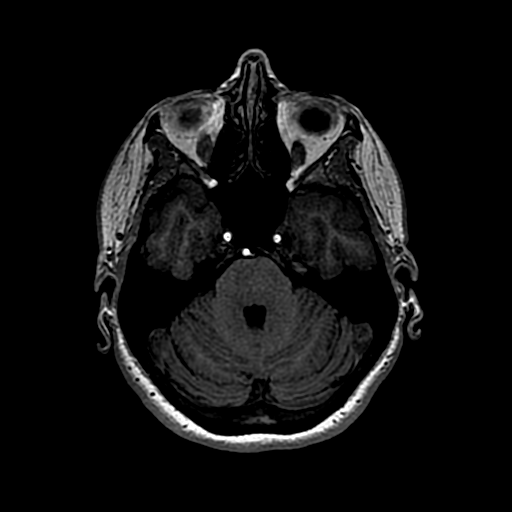
[im 59/130]
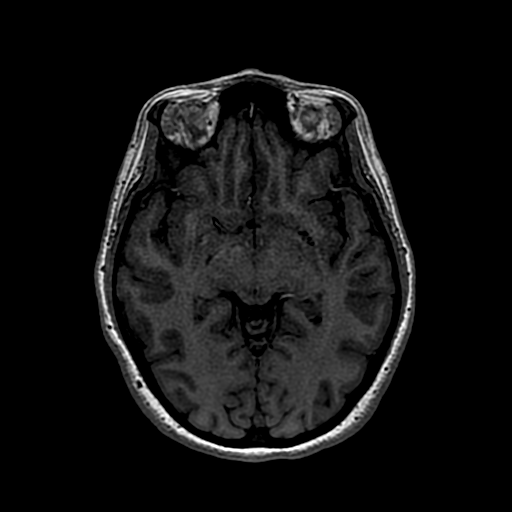

[Series 9: DWI · coronal · 4.0mm · 1.09mm/px · 7 of 78 slices shown (2 of 4)]
[im 1/78]
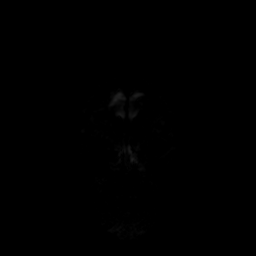
[im 13/78]
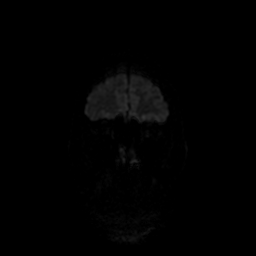
[im 26/78]
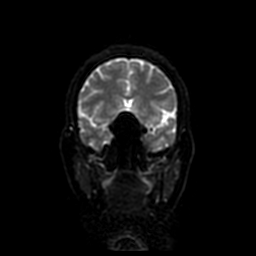
[im 39/78]
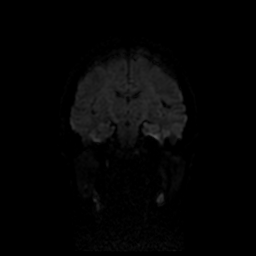
[im 52/78]
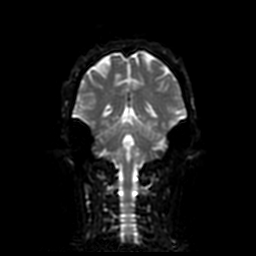
[im 65/78]
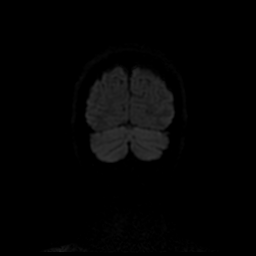
[im 78/78]
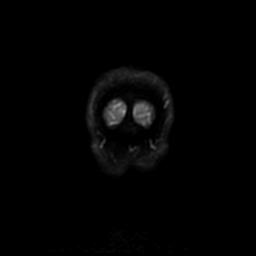

[Series 10: T2 · coronal · 3.0mm · 0.70mm/px · 3 of 34 slices shown (2 of 2)]
[im 1/34]
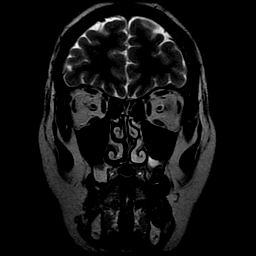
[im 17/34]
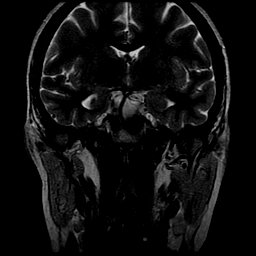
[im 34/34]
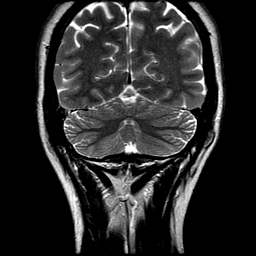

[Series 11: FLAIR · coronal · 3.0mm · 0.35mm/px · 3 of 34 slices shown (2 of 2)]
[im 1/34]
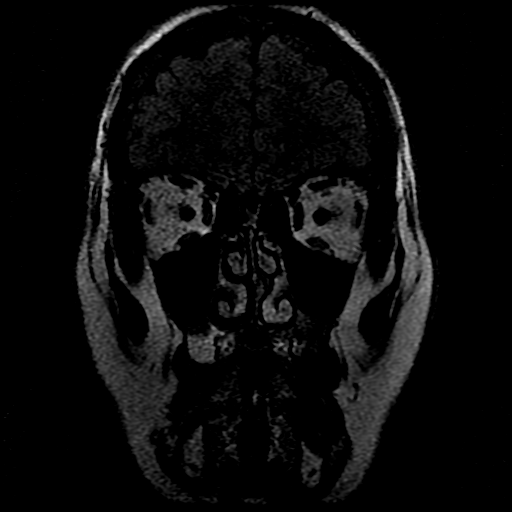
[im 17/34]
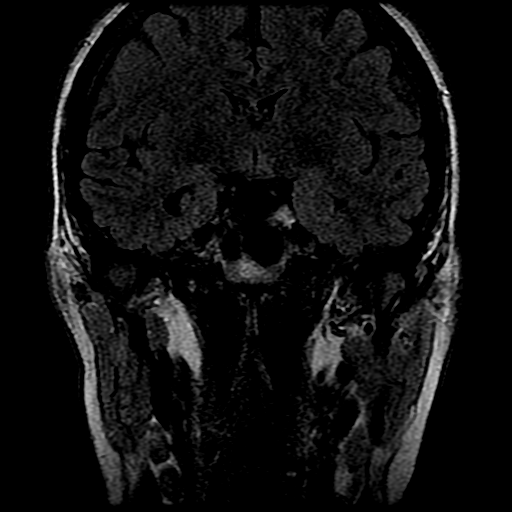
[im 34/34]
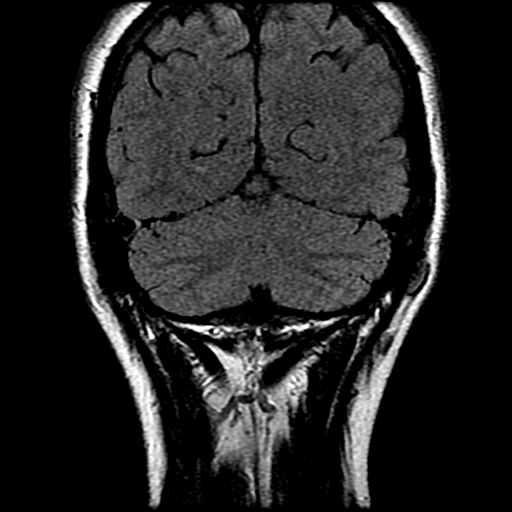

[Series 400: DWI · axial · 3.0mm · 1.09mm/px · z∈[-60,+69]mm · 4 of 45 slices shown (3 of 4)]
[im 1/45]
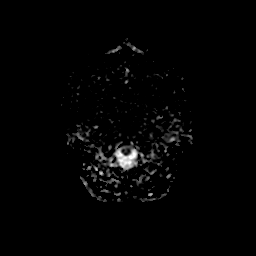
[im 15/45]
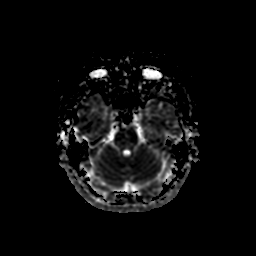
[im 30/45]
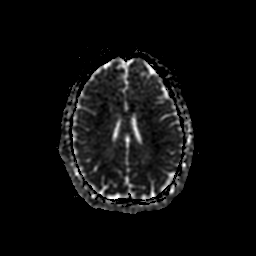
[im 45/45]
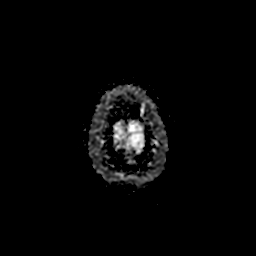

[Series 900: DWI · coronal · 4.0mm · 1.09mm/px · 3 of 39 slices shown (4 of 4)]
[im 1/39]
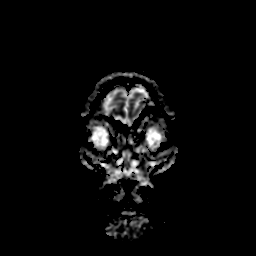
[im 20/39]
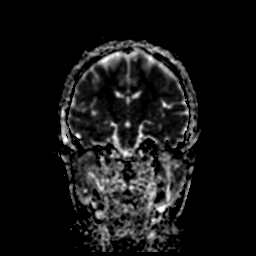
[im 39/39]
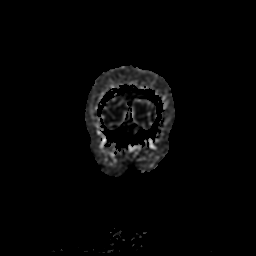

[38 of 48 positions shown; findings below may reference images not displayed]

FINDINGS: Brain: No acute infarct, hemorrhage, or mass lesion is present. No
significant white matter lesions are present. The ventricles are of
normal size.

Dedicated imaging of the temporal lobes demonstrates symmetric size
and signal of the hippocampal structures. No seizure focus is
evident.

The internal auditory canals are within normal limits. The brainstem
and cerebellum are within normal limits.

Vascular: Flow is present in the major intracranial arteries.

Skull and upper cervical spine: The craniocervical junction is
normal. Upper cervical spine is within normal limits. Marrow signal
is unremarkable. Incidental note is again made of a sebaceous cyst
the right parietal scalp measuring 14 mm.

Sinuses/Orbits: The paranasal sinuses and mastoid air cells are
clear. The globes and orbits are within normal limits.
IMPRESSION: Normal MRI of the brain. No acute or focal lesion to explain
seizures.

## 2020-06-03 LAB — COLOGUARD: COLOGUARD: NEGATIVE

## 2020-08-04 ENCOUNTER — Other Ambulatory Visit: Payer: Self-pay | Admitting: Internal Medicine

## 2023-06-24 LAB — COLOGUARD: COLOGUARD: NEGATIVE

## 2024-04-08 ENCOUNTER — Other Ambulatory Visit: Payer: Self-pay | Admitting: Medical Genetics

## 2024-04-15 ENCOUNTER — Other Ambulatory Visit

## 2024-04-15 DIAGNOSIS — Z006 Encounter for examination for normal comparison and control in clinical research program: Secondary | ICD-10-CM

## 2024-04-25 LAB — GENECONNECT MOLECULAR SCREEN: Genetic Analysis Overall Interpretation: NEGATIVE
